# Patient Record
Sex: Female | Born: 1980 | Hispanic: No | Marital: Married | Smoking: Never smoker
Health system: Southern US, Community
[De-identification: ages and names within clinical notes are randomized; demographics above are authoritative.]

## PROBLEM LIST (undated history)

## (undated) DIAGNOSIS — Z9889 Other specified postprocedural states: Secondary | ICD-10-CM

## (undated) DIAGNOSIS — M419 Scoliosis, unspecified: Secondary | ICD-10-CM

## (undated) DIAGNOSIS — F1110 Opioid abuse, uncomplicated: Secondary | ICD-10-CM

## (undated) DIAGNOSIS — R112 Nausea with vomiting, unspecified: Secondary | ICD-10-CM

## (undated) HISTORY — PX: BACK SURGERY: SHX140

---

## 2005-03-20 ENCOUNTER — Emergency Department: Payer: Self-pay | Admitting: Emergency Medicine

## 2005-05-26 ENCOUNTER — Ambulatory Visit: Payer: Self-pay | Admitting: Internal Medicine

## 2005-05-26 ENCOUNTER — Inpatient Hospital Stay: Payer: Self-pay | Admitting: Internal Medicine

## 2005-06-11 ENCOUNTER — Ambulatory Visit: Payer: Self-pay | Admitting: Internal Medicine

## 2005-06-18 ENCOUNTER — Ambulatory Visit: Payer: Self-pay | Admitting: Internal Medicine

## 2008-02-28 ENCOUNTER — Observation Stay: Payer: Self-pay

## 2008-04-07 ENCOUNTER — Inpatient Hospital Stay: Payer: Self-pay | Admitting: Obstetrics & Gynecology

## 2008-04-15 ENCOUNTER — Emergency Department: Payer: Self-pay | Admitting: Emergency Medicine

## 2008-12-21 ENCOUNTER — Emergency Department: Payer: Self-pay | Admitting: Emergency Medicine

## 2010-05-04 ENCOUNTER — Inpatient Hospital Stay: Payer: Self-pay

## 2010-05-08 ENCOUNTER — Observation Stay: Payer: Self-pay

## 2010-05-09 ENCOUNTER — Ambulatory Visit: Payer: Self-pay

## 2010-05-26 ENCOUNTER — Observation Stay: Payer: Self-pay | Admitting: Unknown Physician Specialty

## 2010-06-08 ENCOUNTER — Observation Stay: Payer: Self-pay | Admitting: Obstetrics and Gynecology

## 2010-06-13 ENCOUNTER — Inpatient Hospital Stay: Payer: Self-pay | Admitting: Unknown Physician Specialty

## 2010-09-11 ENCOUNTER — Ambulatory Visit: Payer: Self-pay | Admitting: Orthopedic Surgery

## 2010-12-02 ENCOUNTER — Ambulatory Visit: Payer: Self-pay | Admitting: Family Medicine

## 2011-05-30 ENCOUNTER — Emergency Department: Payer: Self-pay | Admitting: Emergency Medicine

## 2012-12-21 IMAGING — CT CT HEAD WITHOUT CONTRAST
2 series · 16 of 30 positions shown, 20 images · non-contrast
Comparison: none

REASON FOR EXAM: new seizure
COMMENTS:   May transport without cardiac monitor

PROCEDURE:     CT  - CT HEAD WITHOUT CONTRAST  - May 30, 2011  [DATE]
RESULT:     Comparison:  None
TECHNIQUE: Multiple axial images from the foramen magnum to the vertex were
obtained without IV contrast.

[Series 2: without · axial · non-contrast · 0.40mm/px · z∈[-572,-452]mm · 13 of 29 slices shown, 17 images]
[im 3/29  brain]
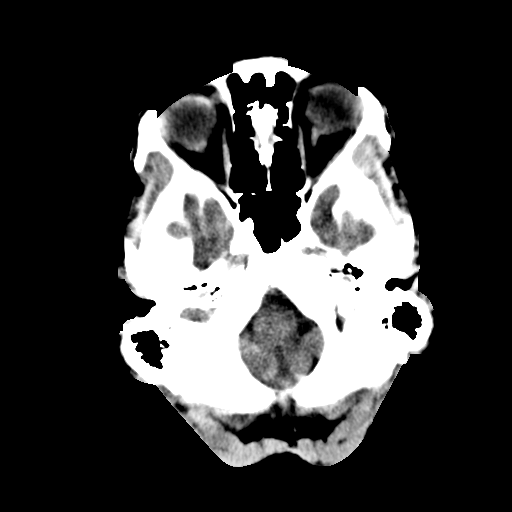
[im 3/29  bone]
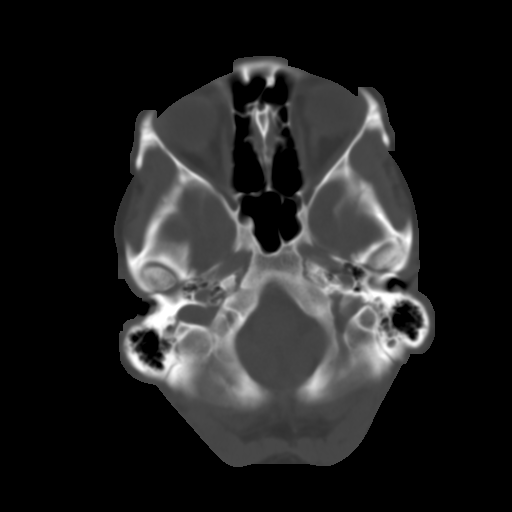
[im 5/29  brain]
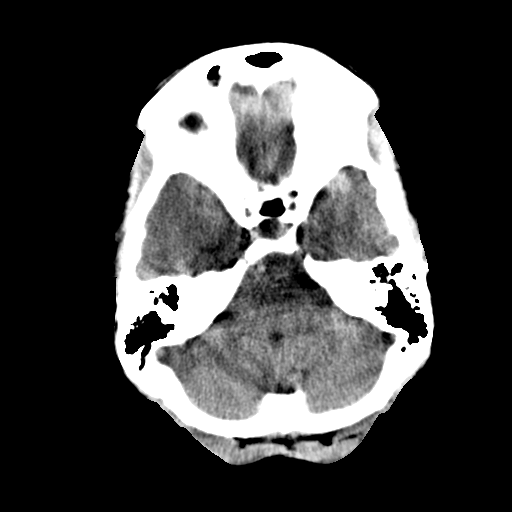
[im 7/29  brain]
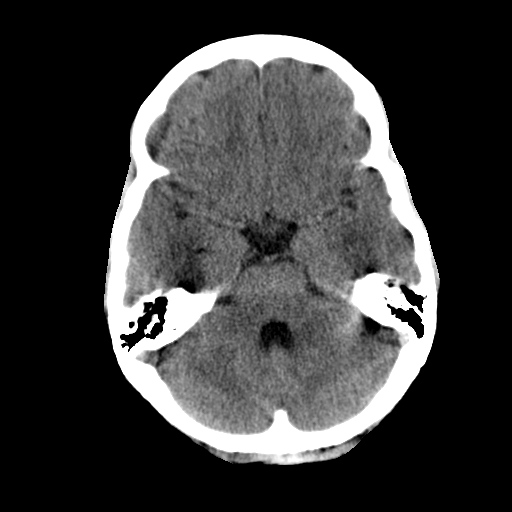
[im 9/29  brain]
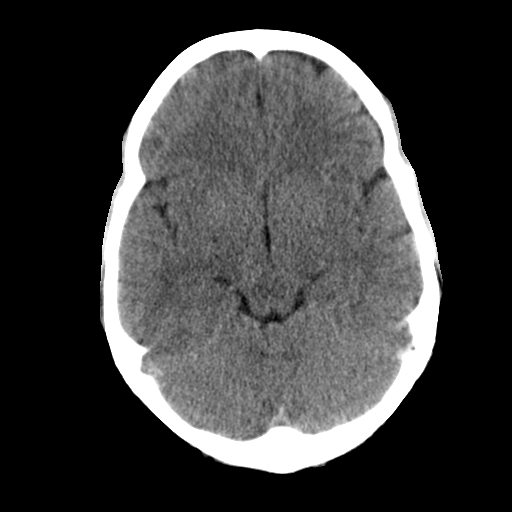
[im 11/29  brain]
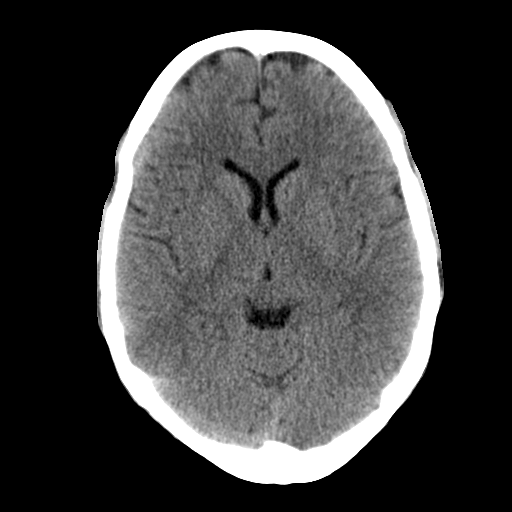
[im 11/29  bone]
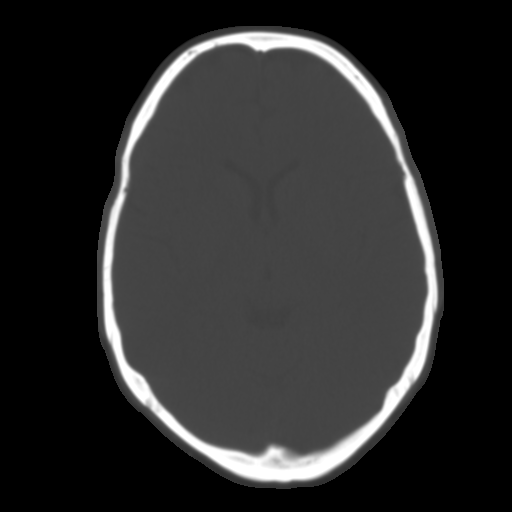
[im 13/29  brain]
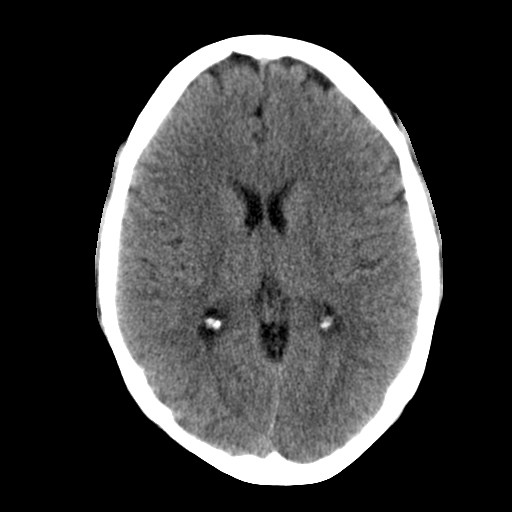
[im 15/29  brain]
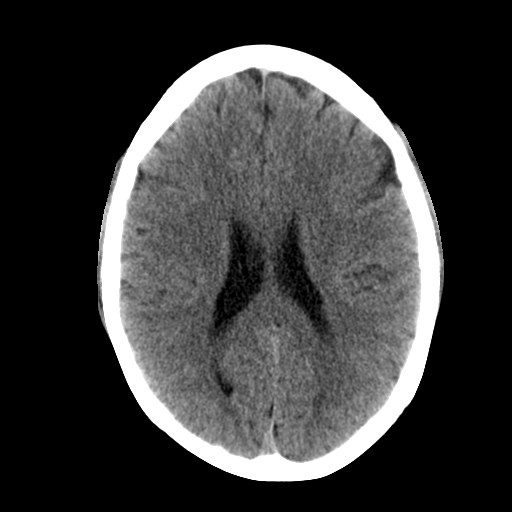
[im 17/29  brain]
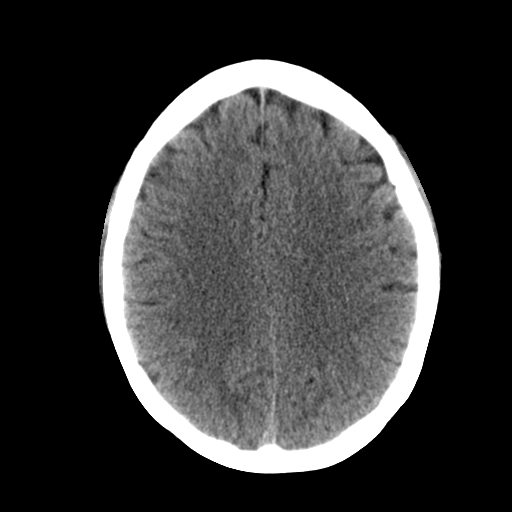
[im 19/29  brain]
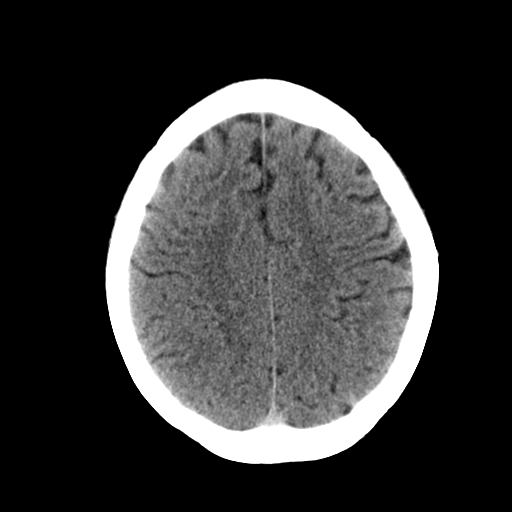
[im 19/29  bone]
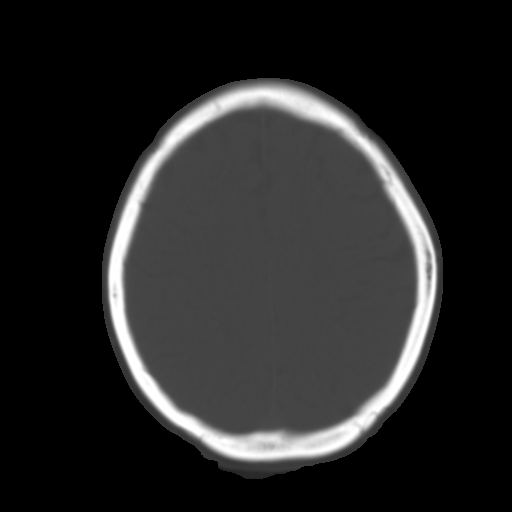
[im 21/29  brain]
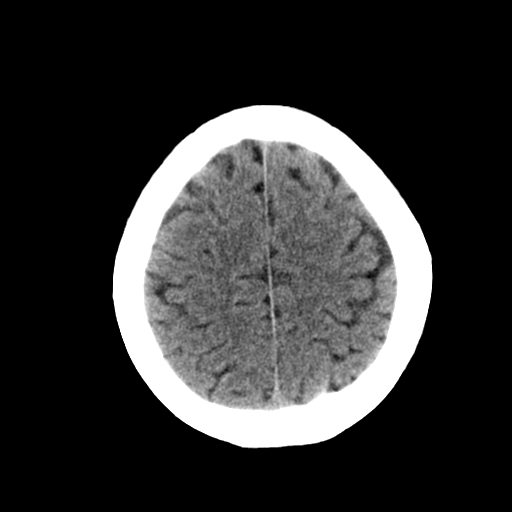
[im 23/29  brain]
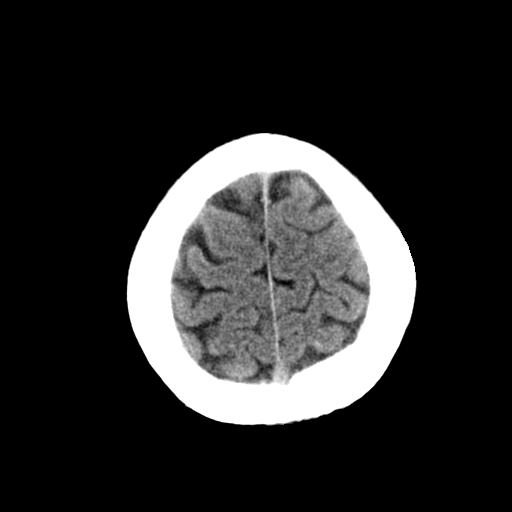
[im 25/29  brain]
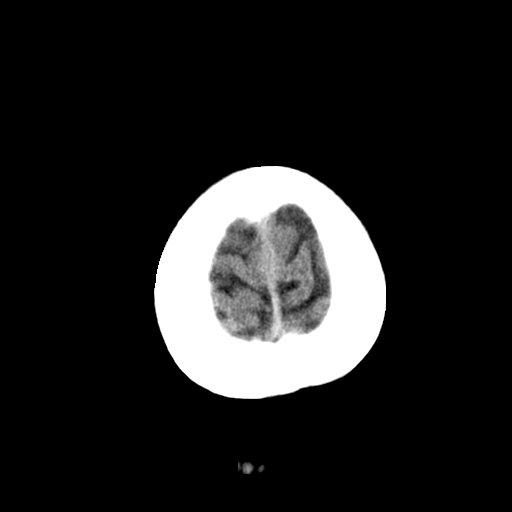
[im 27/29  brain]
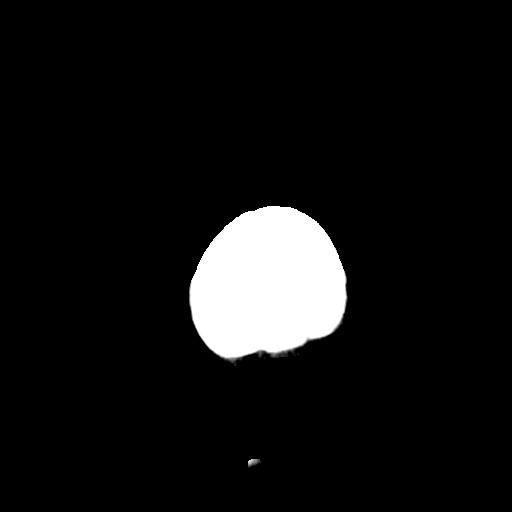
[im 27/29  bone]
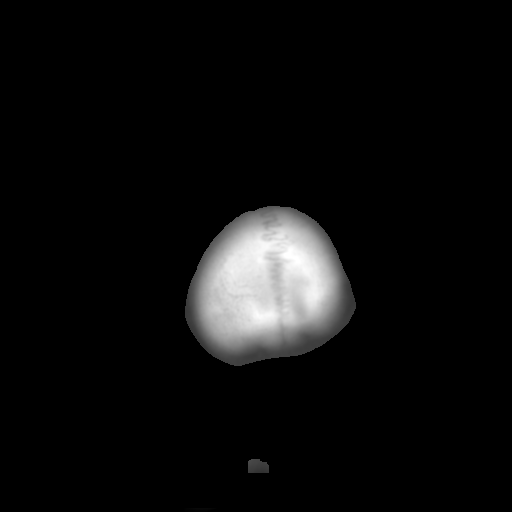

[Series 3: bone · axial · 0.40mm/px · z∈[-572,-532]mm · 3 of 29 slices shown]
[im 3/29  bone]
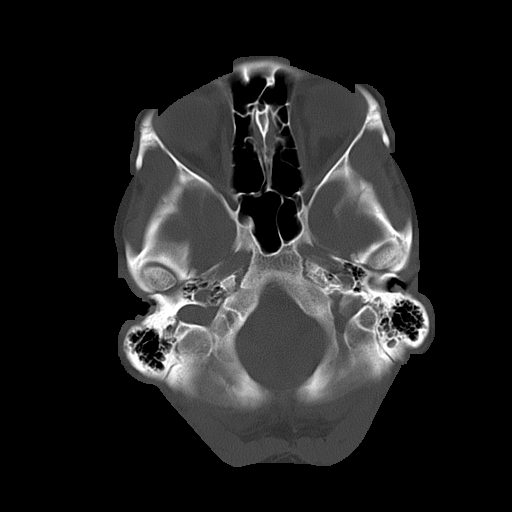
[im 7/29  bone]
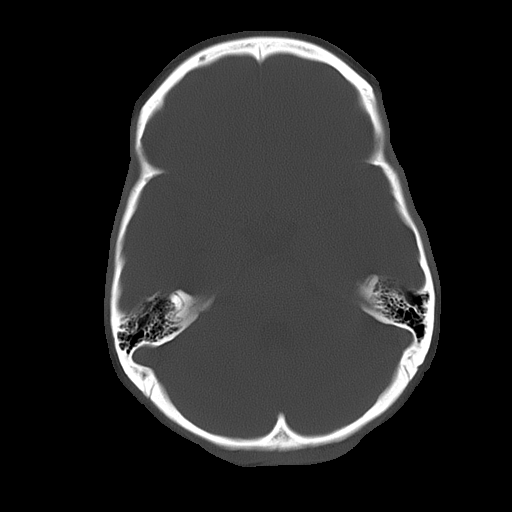
[im 11/29  bone]
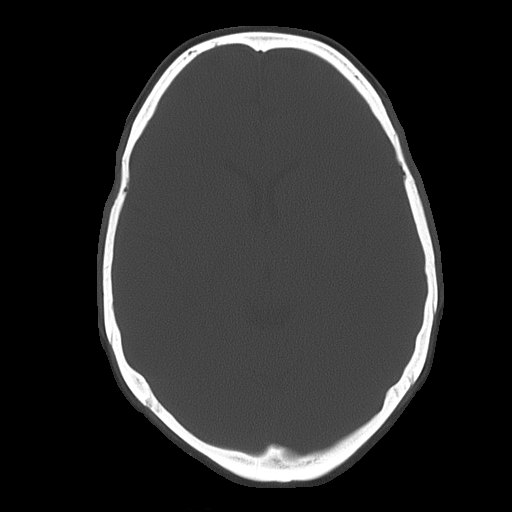

[16 of 30 positions shown; findings below may reference images not displayed]

FINDINGS: There is no evidence for mass effect, midline shift, or extra-axial fluid
collections. There is no evidence for space-occupying lesion, intracranial
hemorrhage, or cortical-based area of infarction.

The osseous structures are unremarkable.
IMPRESSION: No acute intracranial process.

## 2013-04-11 ENCOUNTER — Encounter: Payer: Self-pay | Admitting: Maternal & Fetal Medicine

## 2013-07-30 ENCOUNTER — Inpatient Hospital Stay: Payer: Self-pay | Admitting: Obstetrics & Gynecology

## 2013-07-30 LAB — DRUG SCREEN, URINE
Barbiturates, Ur Screen: NEGATIVE (ref ?–200)
Benzodiazepine, Ur Scrn: NEGATIVE (ref ?–200)
Cocaine Metabolite,Ur ~~LOC~~: NEGATIVE (ref ?–300)
Methadone, Ur Screen: NEGATIVE (ref ?–300)
Opiate, Ur Screen: NEGATIVE (ref ?–300)
Phencyclidine (PCP) Ur S: NEGATIVE (ref ?–25)
Tricyclic, Ur Screen: NEGATIVE (ref ?–1000)

## 2013-07-30 LAB — COMPREHENSIVE METABOLIC PANEL
Albumin: 2.7 g/dL — ABNORMAL LOW (ref 3.4–5.0)
Alkaline Phosphatase: 194 U/L — ABNORMAL HIGH (ref 50–136)
Anion Gap: 8 (ref 7–16)
BUN: 7 mg/dL (ref 7–18)
Bilirubin,Total: 0.1 mg/dL — ABNORMAL LOW (ref 0.2–1.0)
Calcium, Total: 8.9 mg/dL (ref 8.5–10.1)
Chloride: 106 mmol/L (ref 98–107)
Co2: 24 mmol/L (ref 21–32)
Creatinine: 0.53 mg/dL — ABNORMAL LOW (ref 0.60–1.30)
EGFR (African American): >60
EGFR (Non-African Amer.): >60
Glucose: 89 mg/dL (ref 65–99)
Osmolality: 273 (ref 275–301)
Potassium: 4 mmol/L (ref 3.5–5.1)
SGOT(AST): 29 U/L (ref 15–37)
SGPT (ALT): 21 U/L (ref 12–78)
Sodium: 138 mmol/L (ref 136–145)
Total Protein: 6.4 g/dL (ref 6.4–8.2)

## 2013-07-30 LAB — CBC WITH DIFFERENTIAL/PLATELET
Basophil %: 0.9 %
Eosinophil %: 3.9 %
HGB: 12 g/dL (ref 12.0–16.0)
Lymphocyte %: 16.9 %
MCH: 33.4 pg (ref 26.0–34.0)
Monocyte #: 1.3 x10 3/mm — ABNORMAL HIGH (ref 0.2–0.9)
Monocyte %: 8.3 %
Neutrophil #: 11.5 10*3/uL — ABNORMAL HIGH (ref 1.4–6.5)
WBC: 16.3 10*3/uL — ABNORMAL HIGH (ref 3.6–11.0)

## 2013-07-30 LAB — URINALYSIS, COMPLETE
Bacteria: NONE SEEN
Bilirubin,UR: NEGATIVE
Blood: NEGATIVE
Nitrite: NEGATIVE
Protein: NEGATIVE
RBC,UR: 2 /HPF (ref 0–5)

## 2013-07-30 LAB — PROTEIN / CREATININE RATIO, URINE
Creatinine, Urine: 13 mg/dL — ABNORMAL LOW (ref 30.0–125.0)
Protein, Random Urine: <5 mg/dL — ABNORMAL LOW (ref 0–12)

## 2013-08-02 LAB — PATHOLOGY REPORT

## 2014-11-03 IMAGING — US US OB DETAIL+14 WK - NRPT [facility]
1 series · 14 of 28 positions shown · non-contrast
Comparison: none

[Series 1: us ob detail+14 wk - nrpt [facility] · 0.25mm/px · 14 of 103 slices shown]
[im 4/103]
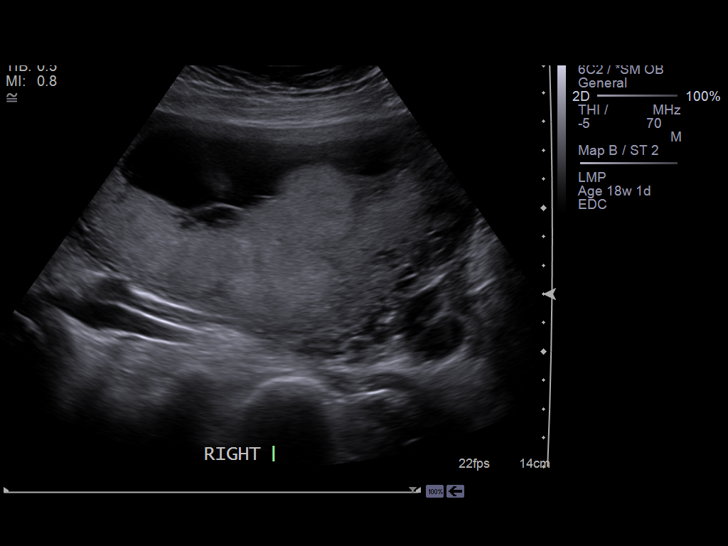
[im 12/103]
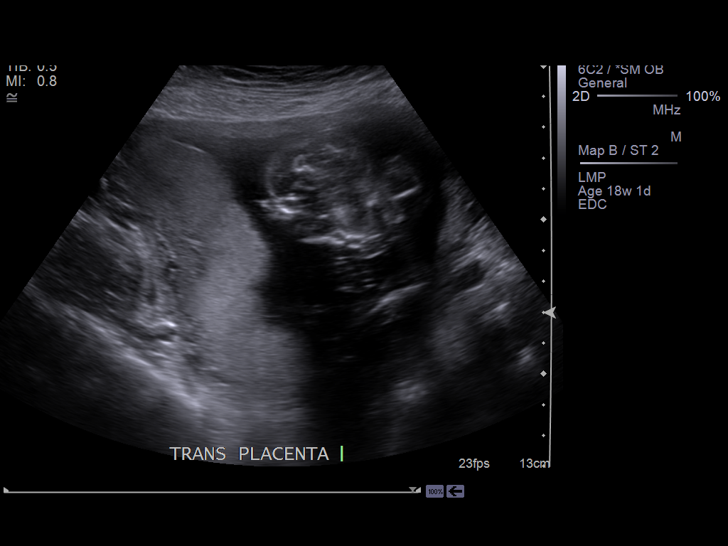
[im 19/103]
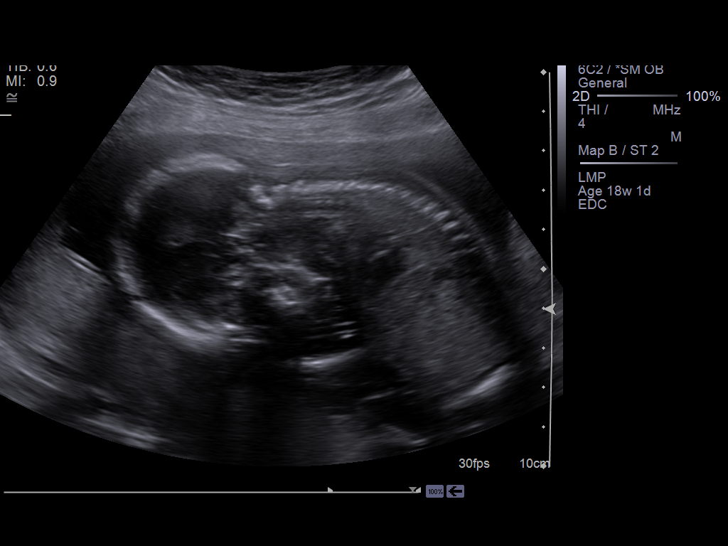
[im 27/103]
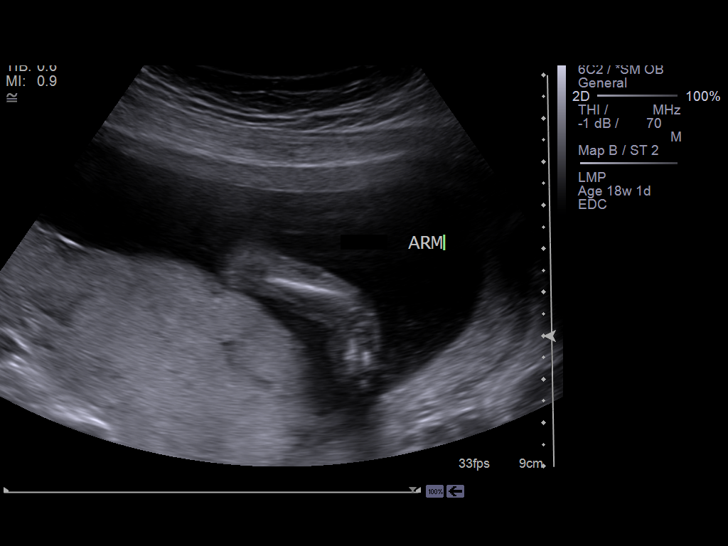
[im 35/103]
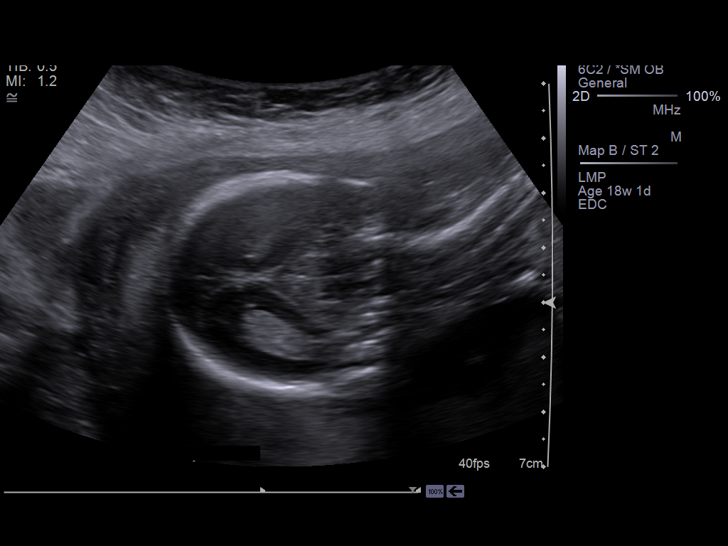
[im 42/103]
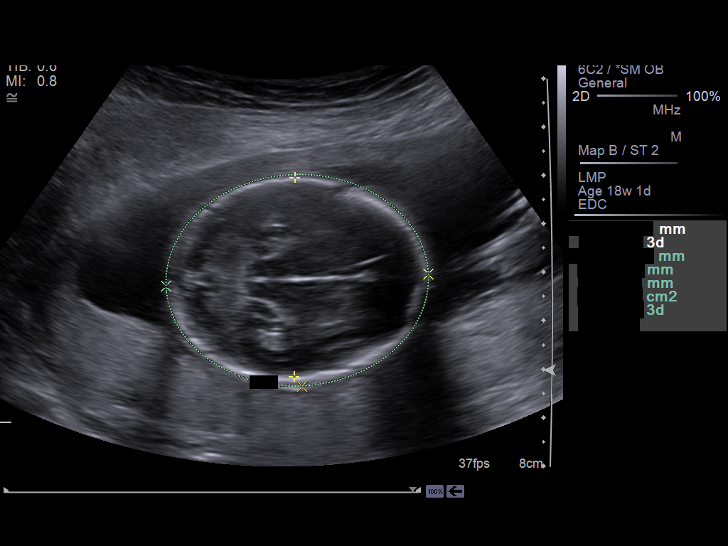
[im 50/103]
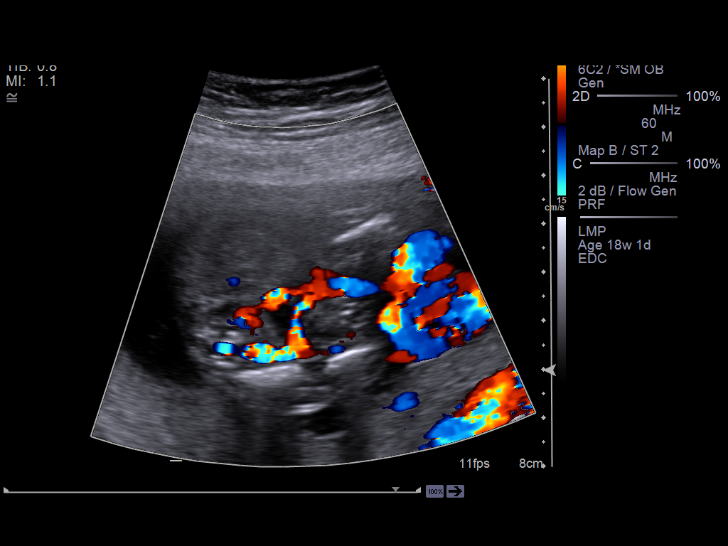
[im 57/103]
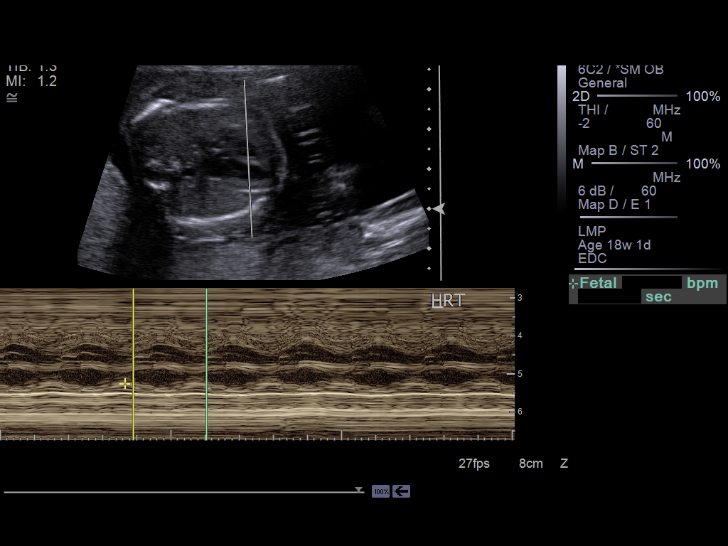
[im 65/103]
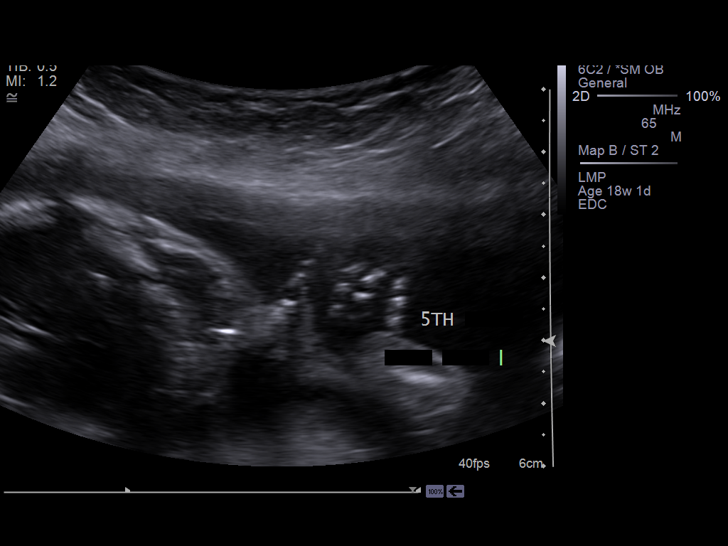
[im 72/103]
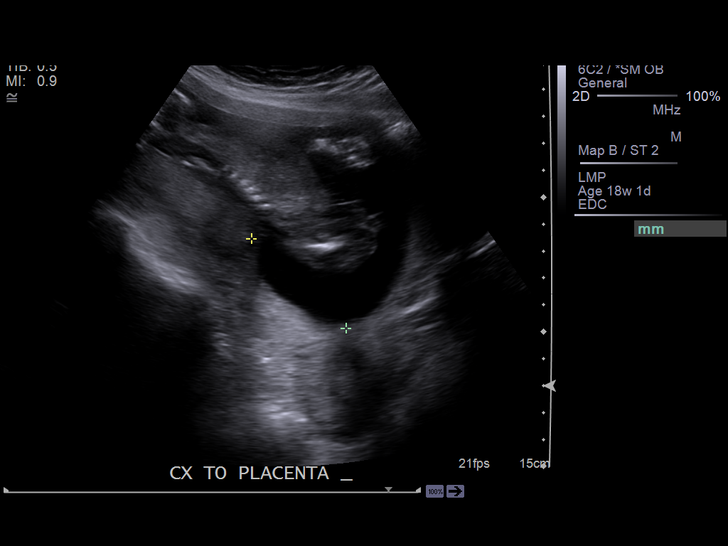
[im 80/103]
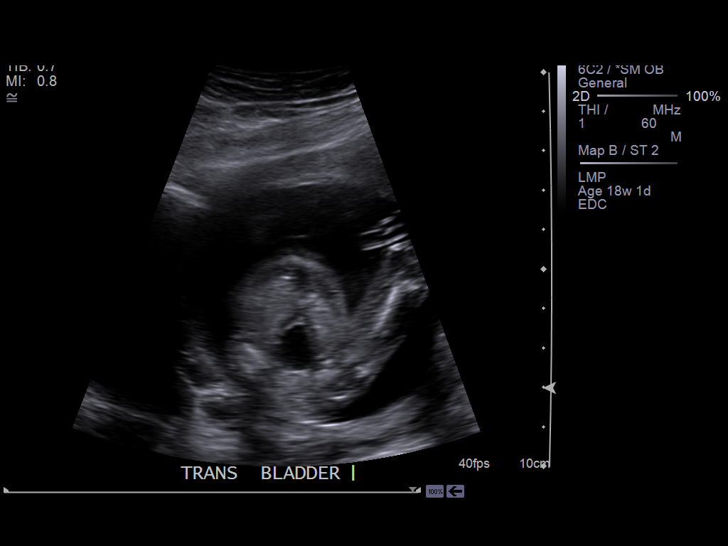
[im 87/103]
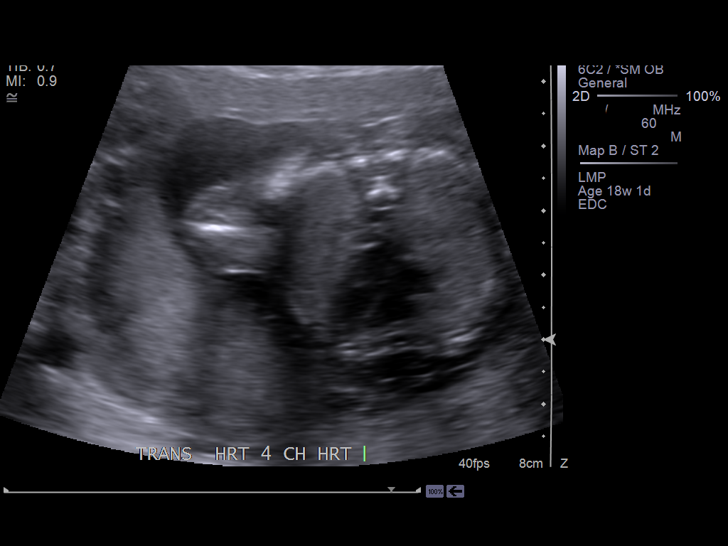
[im 95/103]
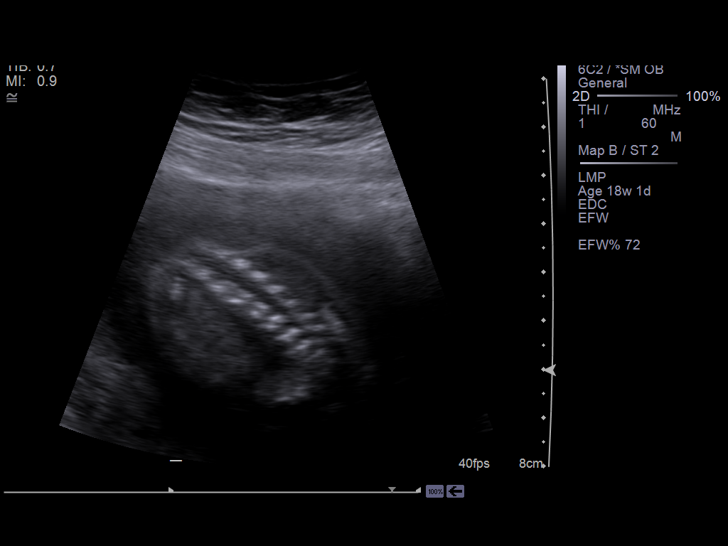
[im 103/103]
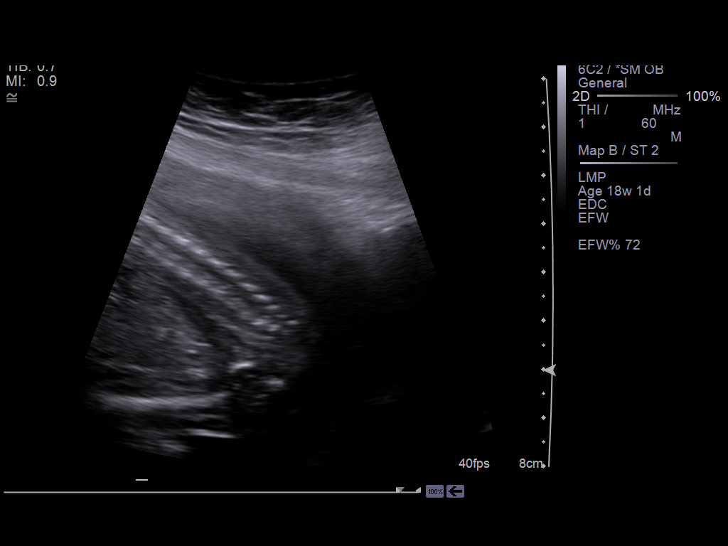

[14 of 28 positions shown; findings below may reference images not displayed]

IMAGES IMPORTED FROM THE [employer] WORKFLOW SYSTEM
NO DICTATION FOR STUDY

## 2015-04-13 NOTE — Consult Note (Signed)
Referral Information:  Reason for Referral 34 yo G1101 at 18 weeks 1 day by ultrasound performed at [facility] on 02/07/13; measurements were consistent with 9 weeks 1 day.  She is referred for consultation due to first trimester medication exposure (cymbalta), late preterm birth,  and history of nonhodgkins lymphoma (in remission).  She is accompanied by her husband today.   Referring Physician [facility]   Past Obstetrical Hx 2009, SVD 36/6 weeks; female, 6 lb 7 oz 2011, SVD, 38 weeks, induced due to hypertension, ?preeclampsia   Home Medications: Medication Instructions Status  Prenatal 1 Prenatal Multivitamins with Folic Acid 0.975 mg oral capsule 1 cap(s) orally once a day Active   Allergies:   Penicillin: Itching, Rash  All Dairy Products: Swelling, Other  Vital Signs/Notes:  Nursing Vital Signs: **Vital Signs.:   21-Apr-14 13:02  Vital Signs Type Routine  Temperature Temperature (F) 97.6  Celsius 36.4  Temperature Source oral  Pulse Pulse 80  Respirations Respirations 20  Systolic BP Systolic BP 114  Diastolic BP (mmHg) Diastolic BP (mmHg) 77  Mean BP 89  Pulse Ox % Pulse Ox % 99   Perinatal Consult:  PMed Hx Rubella Nonimmune, Hx of varicella   Past Medical History cont'd 1. NonHodgkins Lymphoma--diagnosed in 2006, in remission since 2007, followed by Dr. James Ivanoff (last seen 2013) of [facility].  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  Underwent 6 cycles of CHOP (csplatin, adriamycin, oncovorin, prednisone) from 2/07-5/07 2. Seizure disorder--generalized, seizure free for almost one year, took lamictal in past, negative brain MRI 2012.  3. Asthma--stable  4. 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  I did not feel she needed additional screening beyond her midtrimester cervical length (normal today) and general obstetric surveillance.  I discussed 17 Hydroxyprogesterone use in patients with high risk for recurrent preterm birth, however, she is not in the high risk category.  She agreed with this plan.  2. Non hodgkins lymphoma in remission--s/p CHOP. We addressed risk for cardiomyopathy expecially in the setting of adriamycin use with this regimen.  She had normal pre Chemo echocardiogram but is unsure if she has had a subsequent echocardiogram.  We discussed the pregnancy related cardiac remodeling/volume changes and I advised a baseline echocardiogram may be helpful.   Her baseline renal surveillance has been normal.  She will continue to follow up with [facility], Dr. Jeannett Senior.  3. History of hypertensive disease in pregnancy. She is not sure if she had preeclampsia but was induced at term for hypertension.  I advised low dose aspirin for preeclampsia risk reduction.   4. Scoliosis--unable to receive regional anesthesia in past. 5. Depression--she reports stable mood off cymbalta and is aware of her risk for recurrent postpartum depression. We addressed the low risk of teratogenicity associated with this medication and the fact that the highest period of teratogenicity has already occurred.  She also spoke with our genetic counselor regarding this exposure.  Please see that letter for details.  Recommendations 1.  I would recommend baseline maternal echocardiogram due to Adriamycin exposure. 2. Baseline p:c raio and liver function  panel due to history of hypertensive disease in pregnancy.  I recommended she start low dose aspirin.  3. Recommend close surveillance for depression and would encourage her to resume her Cymbalta if needed. 4.  She appears to be up to date with vaccinations and is aware of her relative immunocompromised status (due to splenectomy) Additional pregnancy surveillance as outlined below.   Plan:  Genetic Counseling yes   Prenatal Diagnosis Options Level II US   Ultrasound at what gestational ages at 26-28 weeks and 32-34 weeks    Total Time Spent with Patient 15 minutes   >50% of visit spent in couseling/coordination of care yes   Office Use Only [address]  Level 1 (15min) NEW office consult prob focused   Coding Description: OTHER: nonhodgkins lymphoma, scoliosis, depression.  Electronic Signatures: Consuelo PandySmall, Kirstin Kugler (MD)  (Signed 21-Apr-14 16:24)  Authored: Referral, Home Medications, Allergies, Vital Signs/Notes, Consult, Impression, Plan, Billing, Coding Description   Last Updated: 21-Apr-14 16:24 by Consuelo PandySmall, Ami Thornsberry (MD)

## 2015-05-01 NOTE — H&P (Signed)
L&D Evaluation:  History Expanded:  HPI 34 yo G3P1102 at 5648w6d gestational age by a 9 week ultrasound.  Her pregnancy is complicated by a history of preterm delivery 7450w6d gestational age, preterm labor with  G2 at 7232 weeks (though later induced at term for gestational hypertension), history of non-Hodgkins lymphoma in 2006 (history of adriamycin treatment), history of scoliosis with placement of Harrington rods.  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  She apparently has a history of alcohol addiction as well. She has a history of seizure disorder and is not treated for that. She had Cymbalta exposure early in her pregnancy, but weaned from that.   She presents after being awoken this morning with contractions at about 130am.  She notes worsening contractions and pain, positive fetal movement, no leakage of fluid, and no vaginal bleeding.   Gravida 3   Term 1   PreTerm 1   Abortion 0   Living 2   Blood Type (Maternal) O positive   Group B Strep Results Maternal (Result >5wks must be treated as unknown) unknown/result > 5 weeks ago   Maternal HIV Negative   Maternal Syphilis Ab Nonreactive   Maternal Varicella Non-Immune   Rubella Results (Maternal) equivocal   Maternal T-Dap Unknown   [facility]   Presents with contractions   Patient's Medical History 1) non-Hodgkins Lymphoma (diagnosed 2006, in remission since 2007. Treated with 6 cycles of CHOP (cisplatin, adriamycin, oncvorin, and prednisone from 2/07-5/07), 2) Seizure disorder - generalized without seizures for one year, took lamictal in past, 3) asthma -stable, 4) Scoliosis - s/p Harrington rod placement , 5) history of drug/EtOH abuse - s/p 30 day treatment in October 2013, 6) migraine headaches, 7) Major depression   Patient's Surgical History 1) Distal pancreatectomy and splenectomy in 2005, 2) harrington rod placement (early  2000's), removeal (2012), 3) tonsillecomty (1989)   Medications Pre Natal Vitamins   Allergies 1) PCN -hives, states is able to take cephalosporins, 2) tramadol - seizures   Social History married, history of drug/EtOH abuse - none current   Family History Non-Contributory   ROS:  ROS All systems were reviewed.  HEENT, CNS, GI, GU, Respiratory, CV, Renal and Musculoskeletal systems were found to be normal., unless noted in HPI   Exam:  Vital Signs Afebrile, 140/90, P 116   General mild distress with contractions   Mental Status clear   Chest mild expiratory wheezes throughout   Heart tachycardic, rhythm regular   Abdomen gravid, non-tender   Estimated Fetal Weight Average for gestational age   Fetal Position vtx   Back no CVAT   Edema no edema   Pelvic 6-7cm per RN   Mebranes Intact   FHT Description 140/mod var/+accels/no decels   Ucx 3-4 q 10 min   Impression:  Impression 1) Preterm labor, 2) elevated BPs   Plan:  Comments 1) Preterm labor at 4948w6d gestational age  - Will give BMTZ for fetal lung maturity - will attemp tocolysis with nifedipine 20mg , will repeat in 90 minutes and give 20mg  every 3-8 hours with no more than 180mg /day - Send U/A   2) Elevated BPs: she has a history of gestational hypertension vs preeclampsia.   - Will cycle BPs (though not document at time of this note, have all be >140/90).  Protein negative on dip - Send protein/creatinine ratio - preeclampsia labs - she has no symptoms  3) Fetal well being:  - BMTZ for fetal lung maturity, as above - Ancef 2g now, repeat in 8 hours (for PCN allergy) - GBS collected prior to initiation of antibiotics - NICU aware, will place consult   Labs:  Lab Results:  Urine Drugs:  09-Aug-14 04:35   Tricyclic Antidepressant, Ur Qual (comp) NEGATIVE (Result(s) reported on 30 Jul 2013 at 04:46AM.)  Amphetamines, Urine Qual. NEGATIVE  MDMA, Urine Qual. NEGATIVE  Cocaine Metabolite, Urine  Qual. NEGATIVE  Opiate, Urine qual NEGATIVE  Phencyclidine, Urine Qual. NEGATIVE  Cannabinoid, Urine Qual. NEGATIVE  Barbiturates, Urine Qual. NEGATIVE  Methadone, Urine Qual. NEGATIVE  Routine UA:  09-Aug-14 04:35   Color (UA) Colorless  Clarity (UA) Clear  Glucose (UA) Negative  Bilirubin (UA) Negative  Ketones (UA) Negative  Specific Gravity (UA) 1.002  Blood (UA) Negative  pH (UA) 7.0  Protein (UA) Negative  Nitrite (UA) Negative  Leukocyte Esterase (UA) Trace (Result(s) reported on 30 Jul 2013 at 04:51AM.)  RBC (UA) 2 /HPF  WBC (UA) 2 /HPF  Bacteria (UA) NONE SEEN  Epithelial Cells (UA) 2 /HPF (Result(s) reported on 30 Jul 2013 at 04:51AM.)  Routine Hem:  09-Aug-14 04:48   WBC (CBC)  16.3  RBC (CBC)  3.58  Hemoglobin (CBC) 12.0  Hematocrit (CBC)  34.2  Platelet Count (CBC) 305  MCV 96  MCH 33.4  MCHC 34.9  RDW 13.3  Neutrophil % 70.0  Lymphocyte % 16.9  Monocyte % 8.3  Eosinophil % 3.9  Basophil % 0.9  Neutrophil #  11.5  Lymphocyte # 2.8  Monocyte #  1.3  Eosinophil # 0.6  Basophil # 0.1 (Result(s) reported on 30 Jul 2013 at 05:05AM.)   Electronic Signatures: Conard NovakJackson, Dimond Crotty D (MD)  (Signed 09-Aug-14 05:24)  Authored: L&D Evaluation, Labs   Last Updated: 09-Aug-14 05:24 by Conard NovakJackson, Angeles Paolucci D (MD)

## 2015-12-23 NOTE — L&D Delivery Note (Signed)
Delivery Note At 12:53 AM a viable female was delivered via Vaginal, Spontaneous Delivery (Presentation LOA: ;  ).  APGAR:7/8 , ; weight 9 lb 4.9 oz (4220 g).   Placenta status:meconium stained  , .  Cord3 v:  with the following complications: .   Anesthesia:  nitrous Episiotomy:  none Lacerations:  none Suture Repair: n/a Est. Blood Loss (mL):  200 cc  large female delivered with Mcroberts . Intact perineum . Delayed crying with cord clamping and baby to warmer with PIA. Good movt of both arms . No complications Mom to postpartum.  Baby to Couplet care / Skin to Skin.  Janet Nelson 12/06/2016, 1:17 AM

## 2016-11-19 ENCOUNTER — Inpatient Hospital Stay
Admission: EM | Admit: 2016-11-19 | Discharge: 2016-11-19 | Disposition: A | Discharge status: Home / Self Care | Payer: Managed Care, Other (non HMO) | Attending: Obstetrics and Gynecology | Admitting: Obstetrics and Gynecology

## 2016-11-19 ENCOUNTER — Encounter: Payer: Self-pay | Admitting: *Deleted

## 2016-11-19 DIAGNOSIS — Z3490 Encounter for supervision of normal pregnancy, unspecified, unspecified trimester: Secondary | ICD-10-CM | POA: Insufficient documentation

## 2016-11-19 HISTORY — DX: Opioid abuse, uncomplicated: F11.10

## 2016-11-19 HISTORY — DX: Scoliosis, unspecified: M41.9

## 2016-11-19 MED ORDER — BETAMETHASONE SOD PHOS & ACET 6 (3-3) MG/ML IJ SUSP: 12.0000 mg | Freq: Once | INTRAMUSCULAR | Status: DC

## 2016-11-19 MED ORDER — BETAMETHASONE SOD PHOS & ACET 6 (3-3) MG/ML IJ SUSP
INTRAMUSCULAR | Status: AC
  Administered 2016-11-19: 12 mg via INTRAMUSCULAR
  Filled 2016-11-19: qty 1

## 2016-11-19 MED ORDER — TERBUTALINE SULFATE 1 MG/ML IJ SOLN
0.2500 mg | Freq: Once | INTRAMUSCULAR | Status: AC
  Administered 2016-11-19: 0.25 mg via SUBCUTANEOUS

## 2016-11-19 MED ORDER — BETAMETHASONE SOD PHOS & ACET 6 (3-3) MG/ML IJ SUSP
12.0000 mg | Freq: Once | INTRAMUSCULAR | Status: AC
  Administered 2016-11-19: 12 mg via INTRAMUSCULAR

## 2016-11-19 MED ORDER — TERBUTALINE SULFATE 1 MG/ML IJ SOLN
INTRAMUSCULAR | Status: AC
  Administered 2016-11-19: 0.25 mg via SUBCUTANEOUS
  Filled 2016-11-19: qty 1

## 2016-11-19 NOTE — Discharge Summary (Signed)
  Suzy Bouchardhomas J Schermerhorn, MD  Obstetrics    [] Hide copied text [] Hover for attribution information Patient ID: Janet HahnMelissa J Nelson, female   DOB: 03-29-1981, 35 y.o.   MRN: [id number] Janet Nelson 03-29-1981 G4 P1203Unknown presents for uterine ctx today , 2 prior PTD . received 17P  +LOF , no vaginal bleeding ,  PMHX: nonhodgkins llymphoma sciolosis sz Pshx: pancreatectomy,spinal  Fusion , splenectomy  FHX stroke  HTN   Meds PNV    O; VSS bp 148/92   Repeat 127/92ABD soft  CX 2/60/-2 by RN with recheck and no change   cx check by me at 1920 : 2/60/-1 vtx   NST reactive NST , regular ctx q 2-3 min  Labs:negative nitrazine ,   A: regular ctx without cervical change P:given the intensity will give one sq terbutaline and continue to monitor   1945 : given no cervical change and reassuring fetal monitoring I will give Betamethasone 12 mg IM and repeat in 24 given she is at high risk for PTD .  Precautions given to the pt to return to L+D , pt and husband are comfortable with this .     Electronically signed by Suzy Bouchardhomas J Schermerhorn, MD at 11/19/2016 7:43 PM      ED to Hosp-Admission (Current) on 11/19/2016        Detailed Report

## 2016-11-19 NOTE — Progress Notes (Signed)
Patient ID: Janet HahnMelissa J Nelson, female   DOB: July 02, 1981, 35 y.o.   MRN: [id number] Janet HahnMelissa J Nelson July 02, 1981 G4 P1203Unknown presents for uterine ctx today , 2 prior PTD . received 17P  +LOF , no vaginal bleeding ,  PMHX: nonhodgkins llymphoma sciolosis sz Pshx: pancreatectomy,spinal  Fusion , splenectomy  FHX stroke  HTN   Meds PNV    O; VSS bp 148/92   Repeat 127/92ABD soft  CX 2/60/-2 by RN with recheck and no change   cx check by me at 1920 : 2/60/-1 vtx   NST reactive NST , regular ctx q 2-3 min  Labs:negative nitrazine ,   A: regular ctx without cervical change P:given the intensity will give one sq terbutaline and continue to monitor   1945 : given no cervical change and reassuring fetal monitoring I will give Betamethasone 12 mg IM and repeat in 24 given she is at high risk for PTD .  Precautions given to the pt to return to L+D , pt and husband are comfortable with this .

## 2016-11-19 NOTE — Discharge Instructions (Signed)
Discharge instructions given. Please get plenty of rest and drink fluids. Please return to Emergency room if symptoms worse, period like bleeding, decreased fetal movement, or if water breaks.  Please return to [facility] tomorrow 11/20/2016 at 7:30pm for 2nd dose of Betamethasone shot.

## 2016-11-20 NOTE — Progress Notes (Signed)
Patient presented in L&D for second betamethasone injection.  Given IM in right lateral gluteus.

## 2016-12-05 ENCOUNTER — Inpatient Hospital Stay
Admission: RE | Admit: 2016-12-05 | Discharge: 2016-12-08 | DRG: 767 | Disposition: A | Discharge status: Home / Self Care | Payer: Managed Care, Other (non HMO) | Attending: Obstetrics and Gynecology | Admitting: Obstetrics and Gynecology

## 2016-12-05 DIAGNOSIS — D62 Acute posthemorrhagic anemia: Secondary | ICD-10-CM | POA: Diagnosis not present

## 2016-12-05 DIAGNOSIS — O9962 Diseases of the digestive system complicating childbirth: Secondary | ICD-10-CM | POA: Diagnosis present

## 2016-12-05 DIAGNOSIS — O134 Gestational [pregnancy-induced] hypertension without significant proteinuria, complicating childbirth: Principal | ICD-10-CM | POA: Diagnosis present

## 2016-12-05 DIAGNOSIS — Z6831 Body mass index (BMI) 31.0-31.9, adult: Secondary | ICD-10-CM

## 2016-12-05 DIAGNOSIS — K219 Gastro-esophageal reflux disease without esophagitis: Secondary | ICD-10-CM | POA: Diagnosis present

## 2016-12-05 DIAGNOSIS — O479 False labor, unspecified: Secondary | ICD-10-CM | POA: Diagnosis present

## 2016-12-05 DIAGNOSIS — O139 Gestational [pregnancy-induced] hypertension without significant proteinuria, unspecified trimester: Secondary | ICD-10-CM | POA: Diagnosis present

## 2016-12-05 DIAGNOSIS — O9081 Anemia of the puerperium: Secondary | ICD-10-CM | POA: Diagnosis not present

## 2016-12-05 DIAGNOSIS — Z3A38 38 weeks gestation of pregnancy: Secondary | ICD-10-CM

## 2016-12-05 DIAGNOSIS — M419 Scoliosis, unspecified: Secondary | ICD-10-CM | POA: Diagnosis present

## 2016-12-05 DIAGNOSIS — Z302 Encounter for sterilization: Secondary | ICD-10-CM

## 2016-12-05 DIAGNOSIS — R030 Elevated blood-pressure reading, without diagnosis of hypertension: Secondary | ICD-10-CM | POA: Diagnosis present

## 2016-12-05 HISTORY — DX: Other specified postprocedural states: Z98.890

## 2016-12-05 HISTORY — DX: Nausea with vomiting, unspecified: R11.2

## 2016-12-05 LAB — COMPREHENSIVE METABOLIC PANEL
ALBUMIN: 2.7 g/dL — AB (ref 3.5–5.0)
ALT: 20 U/L (ref 14–54)
AST: 33 U/L (ref 15–41)
Alkaline Phosphatase: 182 U/L — ABNORMAL HIGH (ref 38–126)
Anion gap: 8 (ref 5–15)
BILIRUBIN TOTAL: 0.4 mg/dL (ref 0.3–1.2)
BUN: 15 mg/dL (ref 6–20)
CALCIUM: 10.4 mg/dL — AB (ref 8.9–10.3)
CO2: 24 mmol/L (ref 22–32)
CREATININE: 0.76 mg/dL (ref 0.44–1.00)
Chloride: 102 mmol/L (ref 101–111)
Glucose, Bld: 89 mg/dL (ref 65–99)
Potassium: 3.5 mmol/L (ref 3.5–5.1)
SODIUM: 134 mmol/L — AB (ref 135–145)
Total Protein: 7.2 g/dL (ref 6.5–8.1)

## 2016-12-05 LAB — URINE DRUG SCREEN, QUALITATIVE (ARMC ONLY)
AMPHETAMINES, UR SCREEN: NOT DETECTED
Barbiturates, Ur Screen: NOT DETECTED
Benzodiazepine, Ur Scrn: NOT DETECTED
COCAINE METABOLITE, UR ~~LOC~~: NOT DETECTED
Cannabinoid 50 Ng, Ur ~~LOC~~: NOT DETECTED
MDMA (ECSTASY) UR SCREEN: NOT DETECTED
METHADONE SCREEN, URINE: NOT DETECTED
Opiate, Ur Screen: NOT DETECTED
Phencyclidine (PCP) Ur S: NOT DETECTED
TRICYCLIC, UR SCREEN: NOT DETECTED

## 2016-12-05 LAB — CBC
HCT: 32.9 % — ABNORMAL LOW (ref 35.0–47.0)
Hemoglobin: 11.2 g/dL — ABNORMAL LOW (ref 12.0–16.0)
MCH: 30.6 pg (ref 26.0–34.0)
MCHC: 34 g/dL (ref 32.0–36.0)
MCV: 89.9 fL (ref 80.0–100.0)
Platelets: 324 10*3/uL (ref 150–440)
RBC: 3.66 MIL/uL — AB (ref 3.80–5.20)
RDW: 13.4 % (ref 11.5–14.5)
WBC: 12.1 10*3/uL — AB (ref 3.6–11.0)

## 2016-12-05 LAB — CHLAMYDIA/NGC RT PCR (ARMC ONLY)
CHLAMYDIA TR: NOT DETECTED
N gonorrhoeae: NOT DETECTED

## 2016-12-05 LAB — PROTEIN / CREATININE RATIO, URINE
CREATININE, URINE: 80 mg/dL
PROTEIN CREATININE RATIO: 0.14 mg/mg{creat} (ref 0.00–0.15)
TOTAL PROTEIN, URINE: 11 mg/dL

## 2016-12-05 LAB — OB RESULTS CONSOLE GBS: GBS: POSITIVE

## 2016-12-05 LAB — URIC ACID: Uric Acid, Serum: 7.1 mg/dL — ABNORMAL HIGH (ref 2.3–6.6)

## 2016-12-05 MED ORDER — SOD CITRATE-CITRIC ACID 500-334 MG/5ML PO SOLN: 30.0000 mL | ORAL | Status: DC | PRN

## 2016-12-05 MED ORDER — ACETAMINOPHEN 325 MG PO TABS: 650.0000 mg | ORAL_TABLET | ORAL | Status: DC | PRN

## 2016-12-05 MED ORDER — CEFAZOLIN SODIUM-DEXTROSE 2-4 GM/100ML-% IV SOLN
2.0000 g | Freq: Once | INTRAVENOUS | Status: AC
  Administered 2016-12-05: 2 g via INTRAVENOUS
  Filled 2016-12-05 (×2): qty 100

## 2016-12-05 MED ORDER — OXYCODONE-ACETAMINOPHEN 5-325 MG PO TABS: 2.0000 | ORAL_TABLET | ORAL | Status: DC | PRN

## 2016-12-05 MED ORDER — FAMOTIDINE 20 MG PO TABS
20.0000 mg | ORAL_TABLET | Freq: Two times a day (BID) | ORAL | Status: DC
  Administered 2016-12-05 – 2016-12-07 (×4): 20 mg via ORAL
  Filled 2016-12-05 (×4): qty 1

## 2016-12-05 MED ORDER — TERBUTALINE SULFATE 1 MG/ML IJ SOLN: 0.2500 mg | Freq: Once | INTRAMUSCULAR | Status: DC | PRN

## 2016-12-05 MED ORDER — OXYCODONE-ACETAMINOPHEN 5-325 MG PO TABS: 1.0000 | ORAL_TABLET | ORAL | Status: DC | PRN

## 2016-12-05 MED ORDER — LIDOCAINE HCL (PF) 1 % IJ SOLN: 30.0000 mL | INTRAMUSCULAR | Status: DC | PRN

## 2016-12-05 MED ORDER — LACTATED RINGERS IV SOLN: 500.0000 mL | INTRAVENOUS | Status: DC | PRN

## 2016-12-05 MED ORDER — CEFAZOLIN IN D5W 1 GM/50ML IV SOLN
1.0000 g | Freq: Three times a day (TID) | INTRAVENOUS | Status: DC
  Administered 2016-12-06: 1 g via INTRAVENOUS
  Filled 2016-12-05 (×4): qty 50

## 2016-12-05 MED ORDER — OXYTOCIN 40 UNITS IN LACTATED RINGERS INFUSION - SIMPLE MED
2.5000 [IU]/h | INTRAVENOUS | Status: DC
Start: 2016-12-05 — End: 2016-12-06

## 2016-12-05 MED ORDER — CALCIUM CARBONATE ANTACID 500 MG PO CHEW
CHEWABLE_TABLET | ORAL | Status: AC
  Filled 2016-12-05: qty 1

## 2016-12-05 MED ORDER — SODIUM CHLORIDE 0.9 % IV SOLN: 2.0000 g | Freq: Once | INTRAVENOUS | Status: DC

## 2016-12-05 MED ORDER — FAMOTIDINE 20 MG PO TABS
ORAL_TABLET | ORAL | Status: AC
  Administered 2016-12-05: 20 mg via ORAL
  Filled 2016-12-05: qty 1

## 2016-12-05 MED ORDER — ONDANSETRON HCL 4 MG/2ML IJ SOLN: 4.0000 mg | Freq: Four times a day (QID) | INTRAMUSCULAR | Status: DC | PRN

## 2016-12-05 MED ORDER — OXYTOCIN BOLUS FROM INFUSION: 500.0000 mL | Freq: Once | INTRAVENOUS | Status: DC

## 2016-12-05 MED ORDER — SODIUM CHLORIDE 0.9 % IV SOLN: 1.0000 g | INTRAVENOUS | Status: DC

## 2016-12-05 MED ORDER — BUTORPHANOL TARTRATE 1 MG/ML IJ SOLN: 1.0000 mg | INTRAMUSCULAR | Status: DC | PRN

## 2016-12-05 MED ORDER — LACTATED RINGERS IV SOLN: INTRAVENOUS | Status: DC

## 2016-12-05 MED ORDER — LABETALOL HCL 5 MG/ML IV SOLN
20.0000 mg | INTRAVENOUS | Status: DC | PRN
  Filled 2016-12-05: qty 16

## 2016-12-05 MED ORDER — LACTATED RINGERS IV SOLN
500.0000 mL | INTRAVENOUS | Status: DC | PRN
  Administered 2016-12-06: 500 mL via INTRAVENOUS

## 2016-12-05 MED ORDER — OXYTOCIN 40 UNITS IN LACTATED RINGERS INFUSION - SIMPLE MED: 2.5000 [IU]/h | INTRAVENOUS | Status: DC

## 2016-12-05 MED ORDER — HYDRALAZINE HCL 20 MG/ML IJ SOLN: 10.0000 mg | Freq: Once | INTRAMUSCULAR | Status: DC | PRN

## 2016-12-05 NOTE — Plan of Care (Signed)
Received ancef from pharmacy. Will hang and administer now

## 2016-12-05 NOTE — Progress Notes (Signed)
Pharmacy called regarding ancef 2gm ivpb.

## 2016-12-05 NOTE — H&P (Signed)
Janet Nelson is a 35 y.o. female presenting for  Elevated BP this am in office . CX 4 cm . OB History    Gravida Para Term Preterm AB Living   4 3 1 2  0 3   SAB TAB Ectopic Multiple Live Births                 Past Medical History:  Diagnosis Date  . Opioid abuse    clean for 4 years  . Scoliosis    Past Surgical History:  Procedure Laterality Date  . BACK SURGERY     Family History: family history is not on file. Social History:  reports that she has never smoked. She has never used smokeless tobacco. She reports that she does not drink alcohol or use drugs.     Maternal Diabetes: No Genetic Screening: Normal Maternal Ultrasounds/Referrals: Normal Fetal Ultrasounds or other Referrals:  None Maternal Substance Abuse:  No Significant Maternal Medications:  None Significant Maternal Lab Results:  None Other Comments:  None  ROS History   Last menstrual period 03/15/2016. Examlungs CTA  CV rrr  abd  Pelvic 4 cm in office  Physical Exam  Prenatal labs: ABO, Rh:  O+ Antibody:  neg Rubella:  NOn Immune  VZ : NOn immune RPR:   neg HBsAg:   neg HIV:   neg GBS:   unknown   Assessment/Plan: Recent Gestational hypertension  , advanced cervix  IV BP labetolol and start ancef for unknown GBS   SCHERMERHORN,THOMAS 12/05/2016, 3:22 PM

## 2016-12-05 NOTE — Progress Notes (Deleted)
Patient ID: Janet HahnMelissa J Nelson, female   DOB: January 07, 1981, 35 y.o.   MRN: [id number] AROM . Meconium stained   FSE placed  CX 4cm / 80 / 0  discussed the meaning of meconium with pt and family

## 2016-12-05 NOTE — Plan of Care (Signed)
Pt here for induction of labor for elevated blood pressures.

## 2016-12-06 ENCOUNTER — Encounter
Admission: RE | Disposition: A | Discharge status: Home / Self Care | Payer: Self-pay | Source: Home / Self Care | Attending: Obstetrics and Gynecology

## 2016-12-06 ENCOUNTER — Inpatient Hospital Stay: Payer: Managed Care, Other (non HMO) | Admitting: Anesthesiology

## 2016-12-06 HISTORY — PX: TUBAL LIGATION: SHX77

## 2016-12-06 LAB — CBC
HEMATOCRIT: 27.4 % — AB (ref 35.0–47.0)
Hemoglobin: 9 g/dL — ABNORMAL LOW (ref 12.0–16.0)
MCH: 30 pg (ref 26.0–34.0)
MCHC: 33 g/dL (ref 32.0–36.0)
MCV: 90.9 fL (ref 80.0–100.0)
Platelets: 268 10*3/uL (ref 150–440)
RBC: 3.01 MIL/uL — AB (ref 3.80–5.20)
RDW: 13.7 % (ref 11.5–14.5)
WBC: 20.3 10*3/uL — AB (ref 3.6–11.0)

## 2016-12-06 LAB — RPR: RPR: NONREACTIVE

## 2016-12-06 SURGERY — LIGATION, FALLOPIAN TUBE, POSTPARTUM
Anesthesia: General | Site: Abdomen | Laterality: Bilateral | Wound class: Clean

## 2016-12-06 MED ORDER — HYDROCODONE-ACETAMINOPHEN 5-325 MG PO TABS: 1.0000 | ORAL_TABLET | ORAL | Status: DC | PRN

## 2016-12-06 MED ORDER — ZOLPIDEM TARTRATE 5 MG PO TABS: 5.0000 mg | ORAL_TABLET | Freq: Every evening | ORAL | Status: DC | PRN

## 2016-12-06 MED ORDER — PROMETHAZINE HCL 25 MG/ML IJ SOLN
6.2500 mg | INTRAMUSCULAR | Status: DC | PRN
  Administered 2016-12-06: 12.5 mg via INTRAVENOUS

## 2016-12-06 MED ORDER — ACETAMINOPHEN 325 MG PO TABS
650.0000 mg | ORAL_TABLET | ORAL | Status: DC | PRN
  Administered 2016-12-06 (×3): 650 mg via ORAL
  Filled 2016-12-06 (×4): qty 2

## 2016-12-06 MED ORDER — LIDOCAINE HCL (CARDIAC) 20 MG/ML IV SOLN
INTRAVENOUS | Status: DC | PRN
  Administered 2016-12-06: 50 mg via INTRAVENOUS

## 2016-12-06 MED ORDER — DIBUCAINE 1 % RE OINT: 1.0000 "application " | TOPICAL_OINTMENT | RECTAL | Status: DC | PRN

## 2016-12-06 MED ORDER — ROCURONIUM BROMIDE 100 MG/10ML IV SOLN
INTRAVENOUS | Status: DC | PRN
  Administered 2016-12-06: 25 mg via INTRAVENOUS

## 2016-12-06 MED ORDER — PROPOFOL 10 MG/ML IV BOLUS
INTRAVENOUS | Status: DC | PRN
  Administered 2016-12-06: 140 mg via INTRAVENOUS

## 2016-12-06 MED ORDER — SENNOSIDES-DOCUSATE SODIUM 8.6-50 MG PO TABS
2.0000 | ORAL_TABLET | ORAL | Status: DC
  Administered 2016-12-07 (×2): 2 via ORAL
  Filled 2016-12-06 (×2): qty 2

## 2016-12-06 MED ORDER — PRENATAL MULTIVITAMIN CH
1.0000 | ORAL_TABLET | Freq: Every day | ORAL | Status: DC
  Administered 2016-12-06 – 2016-12-07 (×2): 1 via ORAL
  Filled 2016-12-06 (×2): qty 1

## 2016-12-06 MED ORDER — FENTANYL CITRATE (PF) 100 MCG/2ML IJ SOLN
25.0000 ug | INTRAMUSCULAR | Status: DC | PRN
  Administered 2016-12-06 (×2): 25 ug via INTRAVENOUS

## 2016-12-06 MED ORDER — BENZOCAINE-MENTHOL 20-0.5 % EX AERO: 1.0000 "application " | INHALATION_SPRAY | CUTANEOUS | Status: DC | PRN

## 2016-12-06 MED ORDER — PROMETHAZINE HCL 25 MG/ML IJ SOLN
INTRAMUSCULAR | Status: AC
  Administered 2016-12-06: 12.5 mg via INTRAVENOUS
  Filled 2016-12-06: qty 1

## 2016-12-06 MED ORDER — COCONUT OIL OIL: 1.0000 "application " | TOPICAL_OIL | Status: DC | PRN

## 2016-12-06 MED ORDER — ACETAMINOPHEN 10 MG/ML IV SOLN
INTRAVENOUS | Status: DC | PRN
  Administered 2016-12-06: 1000 mg via INTRAVENOUS

## 2016-12-06 MED ORDER — DIPHENHYDRAMINE HCL 25 MG PO CAPS
25.0000 mg | ORAL_CAPSULE | Freq: Four times a day (QID) | ORAL | Status: DC | PRN
Start: 2016-12-06 — End: 2016-12-08
  Administered 2016-12-07: 25 mg via ORAL
  Filled 2016-12-06: qty 1

## 2016-12-06 MED ORDER — LABETALOL HCL 100 MG PO TABS
ORAL_TABLET | ORAL | Status: AC
  Administered 2016-12-06: 100 mg via ORAL
  Filled 2016-12-06: qty 1

## 2016-12-06 MED ORDER — FERROUS SULFATE 325 (65 FE) MG PO TABS
325.0000 mg | ORAL_TABLET | Freq: Two times a day (BID) | ORAL | Status: DC
  Administered 2016-12-06 – 2016-12-08 (×4): 325 mg via ORAL
  Filled 2016-12-06 (×5): qty 1

## 2016-12-06 MED ORDER — FENTANYL CITRATE (PF) 100 MCG/2ML IJ SOLN
INTRAMUSCULAR | Status: DC | PRN
  Administered 2016-12-06: 100 ug via INTRAVENOUS

## 2016-12-06 MED ORDER — AMMONIA AROMATIC IN INHA
RESPIRATORY_TRACT | Status: AC
  Filled 2016-12-06: qty 20

## 2016-12-06 MED ORDER — SERTRALINE HCL 100 MG PO TABS
100.0000 mg | ORAL_TABLET | Freq: Every day | ORAL | Status: DC
  Administered 2016-12-06 – 2016-12-08 (×3): 100 mg via ORAL
  Filled 2016-12-06 (×3): qty 1

## 2016-12-06 MED ORDER — ONDANSETRON HCL 4 MG/2ML IJ SOLN: 4.0000 mg | INTRAMUSCULAR | Status: DC | PRN

## 2016-12-06 MED ORDER — SUCCINYLCHOLINE CHLORIDE 20 MG/ML IJ SOLN
INTRAMUSCULAR | Status: DC | PRN
  Administered 2016-12-06: 100 mg via INTRAVENOUS

## 2016-12-06 MED ORDER — LABETALOL HCL 100 MG PO TABS
100.0000 mg | ORAL_TABLET | Freq: Two times a day (BID) | ORAL | Status: DC
  Administered 2016-12-06 – 2016-12-08 (×6): 100 mg via ORAL
  Filled 2016-12-06 (×5): qty 1

## 2016-12-06 MED ORDER — IBUPROFEN 600 MG PO TABS
600.0000 mg | ORAL_TABLET | Freq: Four times a day (QID) | ORAL | Status: DC
  Administered 2016-12-06 – 2016-12-08 (×10): 600 mg via ORAL
  Filled 2016-12-06 (×9): qty 1

## 2016-12-06 MED ORDER — SUGAMMADEX SODIUM 200 MG/2ML IV SOLN
INTRAVENOUS | Status: DC | PRN
  Administered 2016-12-06: 200 mg via INTRAVENOUS

## 2016-12-06 MED ORDER — MAGNESIUM HYDROXIDE 400 MG/5ML PO SUSP: 30.0000 mL | ORAL | Status: DC | PRN

## 2016-12-06 MED ORDER — BUPIVACAINE LIPOSOME 1.3 % IJ SUSP
INTRAMUSCULAR | Status: DC | PRN
  Administered 2016-12-06: 40 mL

## 2016-12-06 MED ORDER — MEASLES, MUMPS & RUBELLA VAC ~~LOC~~ INJ
0.5000 mL | INJECTION | Freq: Once | SUBCUTANEOUS | Status: DC
  Filled 2016-12-06: qty 0.5

## 2016-12-06 MED ORDER — KETOROLAC TROMETHAMINE 30 MG/ML IJ SOLN
INTRAMUSCULAR | Status: DC | PRN
  Administered 2016-12-06: 30 mg via INTRAVENOUS

## 2016-12-06 MED ORDER — LACTATED RINGERS IV SOLN
INTRAVENOUS | Status: DC | PRN
  Administered 2016-12-06: 11:00:00 via INTRAVENOUS

## 2016-12-06 MED ORDER — WITCH HAZEL-GLYCERIN EX PADS: 1.0000 "application " | MEDICATED_PAD | CUTANEOUS | Status: DC | PRN

## 2016-12-06 MED ORDER — IBUPROFEN 600 MG PO TABS
ORAL_TABLET | ORAL | Status: AC
  Administered 2016-12-06: 600 mg via ORAL
  Filled 2016-12-06: qty 1

## 2016-12-06 MED ORDER — ONDANSETRON HCL 4 MG PO TABS: 4.0000 mg | ORAL_TABLET | ORAL | Status: DC | PRN

## 2016-12-06 MED ORDER — FENTANYL CITRATE (PF) 100 MCG/2ML IJ SOLN
INTRAMUSCULAR | Status: AC
  Administered 2016-12-06: 25 ug via INTRAVENOUS
  Filled 2016-12-06: qty 2

## 2016-12-06 MED ORDER — SIMETHICONE 80 MG PO CHEW: 80.0000 mg | CHEWABLE_TABLET | ORAL | Status: DC | PRN

## 2016-12-06 SURGICAL SUPPLY — 41 items
APPLICATOR COTTON TIP 6IN STRL (MISCELLANEOUS) ×3 IMPLANT
BLADE SURG SZ11 CARB STEEL (BLADE) ×3 IMPLANT
CANISTER SUCT 1200ML W/VALVE (MISCELLANEOUS) ×3 IMPLANT
CHLORAPREP W/TINT 26ML (MISCELLANEOUS) ×3 IMPLANT
CLOSURE WOUND 1/4X4 (GAUZE/BANDAGES/DRESSINGS) ×1
DRAPE LAPAROTOMY 100X77 ABD (DRAPES) ×3 IMPLANT
DRSG TEGADERM 2-3/8X2-3/4 SM (GAUZE/BANDAGES/DRESSINGS) ×3 IMPLANT
DRSG TEGADERM 4X4.75 (GAUZE/BANDAGES/DRESSINGS) ×3 IMPLANT
ELECT CAUTERY BLADE 6.4 (BLADE) ×3 IMPLANT
ELECT REM PT RETURN 9FT ADLT (ELECTROSURGICAL) ×3
ELECTRODE REM PT RTRN 9FT ADLT (ELECTROSURGICAL) ×1 IMPLANT
GAUZE SPONGE NON-WVN 2X2 STRL (MISCELLANEOUS) ×1 IMPLANT
GLOVE BIO SURGEON STRL SZ8 (GLOVE) ×3 IMPLANT
GLOVE PI ORTHOPRO 6.5 (GLOVE) ×2
GLOVE PI ORTHOPRO STRL 6.5 (GLOVE) IMPLANT
GLOVE SURG SYN 6.5 ES PF (GLOVE) ×3 IMPLANT
GLOVE SURG SYN 6.5 PF PI (GLOVE) IMPLANT
GOWN STRL REUS W/ TWL LRG LVL3 (GOWN DISPOSABLE) ×1 IMPLANT
GOWN STRL REUS W/ TWL XL LVL3 (GOWN DISPOSABLE) ×1 IMPLANT
GOWN STRL REUS W/TWL LRG LVL3 (GOWN DISPOSABLE) ×3
GOWN STRL REUS W/TWL XL LVL3 (GOWN DISPOSABLE) ×3
KIT RM TURNOVER STRD PROC AR (KITS) ×3 IMPLANT
LABEL OR SOLS (LABEL) ×3 IMPLANT
LIQUID BAND (GAUZE/BANDAGES/DRESSINGS) ×3 IMPLANT
NDL HYPO 25X1 1.5 SAFETY (NEEDLE) ×1 IMPLANT
NEEDLE HYPO 25X1 1.5 SAFETY (NEEDLE) ×3 IMPLANT
NS IRRIG 500ML POUR BTL (IV SOLUTION) ×3 IMPLANT
PACK BASIN MINOR ARMC (MISCELLANEOUS) ×3 IMPLANT
RETRACTOR WOUND ALXS 18CM SML (MISCELLANEOUS) IMPLANT
RTRCTR WOUND ALEXIS O 18CM SML (MISCELLANEOUS) ×3
SPONGE LAP 18X18 5 PK (GAUZE/BANDAGES/DRESSINGS) ×2 IMPLANT
SPONGE VERSALON 2X2 STRL (MISCELLANEOUS) ×3
STRIP CLOSURE SKIN 1/4X4 (GAUZE/BANDAGES/DRESSINGS) ×2 IMPLANT
SUT MNCRL AB 4-0 PS2 18 (SUTURE) ×2 IMPLANT
SUT PLAIN GUT 0 (SUTURE) ×6 IMPLANT
SUT VIC AB 2-0 CT1 (SUTURE) ×4 IMPLANT
SUT VIC AB 2-0 UR6 27 (SUTURE) ×5 IMPLANT
SUT VIC AB 4-0 SH 27 (SUTURE) ×3
SUT VIC AB 4-0 SH 27XANBCTRL (SUTURE) ×1 IMPLANT
SWABSTK COMLB BENZOIN TINCTURE (MISCELLANEOUS) ×3 IMPLANT
SYRINGE 10CC LL (SYRINGE) ×3 IMPLANT

## 2016-12-06 NOTE — Progress Notes (Signed)
Took patient up to the bathroom at 3:12, as we were nearing the bathroom she felt a bit lightheaded. We quickly went to the bathroom, while in the toilet she felt like she "had to pass out and blackout". I have her an ammonia inhalent, apple juice, and wet rag while in the toilet. We were in there for about 10 mins while she felt better to go to the wheelchair. Right afterwards checked her BP, initial BP was 96/65. Gave her 500mL bolus starting at 0328. Her BP were ranging in the upper 140s/150s throughout her labor. I gave her 100mg  of Labetalol PO per order at 0158. The drop of BP, getting up to void all together I think caused this episode. Dr. Elesa MassedWard was notified and update at [id number]. Patient doing really well now. No longer light-ended.

## 2016-12-06 NOTE — Anesthesia Procedure Notes (Signed)
Procedure Name: Intubation Date/Time: 12/06/2016 10:42 AM Performed by: ZOXWRUEKILDUFF, Carinne Brandenburger Pre-anesthesia Checklist: Patient identified, Emergency Drugs available, Timeout performed, Patient being monitored and Suction available Patient Re-evaluated:Patient Re-evaluated prior to inductionOxygen Delivery Method: Circle system utilized Preoxygenation: Pre-oxygenation with 100% oxygen Intubation Type: IV induction and Rapid sequence Laryngoscope Size: Mac and 4 Grade View: Grade I Tube type: Oral Tube size: 7.0 mm Number of attempts: 1 Placement Confirmation: ETT inserted through vocal cords under direct vision,  CO2 detector,  breath sounds checked- equal and bilateral and positive ETCO2 Secured at: 22 cm Tube secured with: Tape

## 2016-12-06 NOTE — Progress Notes (Signed)
PPD #0, SVD, baby boy   S:  Reports feeling better, still feels slightly dizzy with ambulation             Tolerating po/ No nausea or vomiting             Bleeding is light/appropriate              Pain controlled with Motrin             Up ad lib / ambulatory with assistance / voiding QS  Newborn breast feeding  O:               VS: BP 122/83 (BP Location: Right Arm)   Pulse (!) 103   Temp 98.5 F (36.9 C) (Oral)   Resp 17   Ht 5\' 7"  (1.702 m)   Wt 90.7 kg (200 lb)   LMP 03/15/2016   SpO2 98%   Breastfeeding? Unknown   BMI 31.32 kg/m    LABS:              Recent Labs  12/05/16 1518 12/06/16 0607  WBC 12.1* 20.3*  HGB 11.2* 9.0*  PLT 324 268               Blood type: --/--/O POS (12/15 1518)  Rubella:        Non-immune                I&O: Intake/Output      12/15 0701 - 12/16 0700 12/16 0701 - 12/17 0700   Urine (mL/kg/hr) 600    Total Output 600     Net -600          Urine Occurrence 2 x                  Physical Exam:             Alert and oriented X3  Exam deferred until family leaves     A: PPD # 0   Gestational HTN - delivered, BPs stable, on Labetalol 100mg  BID  Doing well - stable status  Rubella and Varicella non-immune  ABL Anemia   P: Routine post partum orders  Planning for PP BTL today - has been NPO since 0330   Oral FE BID   Anticipate discharge home tomorrow or Monday  Carlean JewsMeredith Britini Garcilazo, [place]

## 2016-12-06 NOTE — Anesthesia Preprocedure Evaluation (Signed)
Anesthesia Evaluation  Patient identified by MRN, date of birth, ID band Patient awake    Reviewed: Allergy & Precautions, H&P , NPO status , Patient's Chart, lab work & pertinent test results  History of Anesthesia Complications Negative for: history of anesthetic complications  Airway Mallampati: III  TM Distance: >3 FB Neck ROM: full    Dental no notable dental hx. (+) Poor Dentition   Pulmonary neg pulmonary ROS, neg shortness of breath,    Pulmonary exam normal breath sounds clear to auscultation       Cardiovascular Exercise Tolerance: Good hypertension, (-) angina(-) Past MI Normal cardiovascular exam Rhythm:regular Rate:Normal     Neuro/Psych Seizures -, Well Controlled,  negative psych ROS   GI/Hepatic Neg liver ROS, GERD  Controlled,  Endo/Other  negative endocrine ROS  Renal/GU      Musculoskeletal   Abdominal   Peds  Hematology negative hematology ROS (+)   Anesthesia Other Findings Past Medical History: No date: Opioid abuse     Comment: clean for 4 years No date: Scoliosis  Past Surgical History: No date: BACK SURGERY  BMI    Body Mass Index:  31.32 kg/m      Reproductive/Obstetrics negative OB ROS                             Anesthesia Physical Anesthesia Plan  ASA: III  Anesthesia Plan: General ETT   Post-op Pain Management:    Induction:   Airway Management Planned:   Additional Equipment:   Intra-op Plan:   Post-operative Plan:   Informed Consent: I have reviewed the patients History and Physical, chart, labs and discussed the procedure including the risks, benefits and alternatives for the proposed anesthesia with the patient or authorized representative who has indicated his/her understanding and acceptance.   Dental Advisory Given  Plan Discussed with: Anesthesiologist, CRNA and Surgeon  Anesthesia Plan Comments:         Anesthesia  Quick Evaluation

## 2016-12-06 NOTE — Discharge Summary (Signed)
  Obstetric Discharge Summary Reason for Admission: htn Prenatal Procedures: none Intrapartum Procedures: spontaneous vaginal delivery, meconium staining . 2 gm Ancef given 7 + hr before for unknown GBS status Postpartum Procedures: PP BTL  Complications-Operative and Postpartum: none Last Labs       Hemoglobin  Date Value Ref Range Status  12/05/2016 11.2 (L) 12.0 - 16.0 g/dL Final        HGB  Date Value Ref Range Status  07/30/2013 12.0 12.0 - 16.0 g/dL Final        HCT  Date Value Ref Range Status  12/05/2016 32.9 (L) 35.0 - 47.0 % Final  07/31/2013 33.9 (L) 35.0 - 47.0 % Final      Physical Exam:  General: alert and cooperative Lochia: appropriate Uterine Fundus: firm Incision: n/a DVT Evaluation: No evidence of DVT seen on physical exam.  Discharge Diagnoses: Term Pregnancy-delivered  Discharge Information: Date: 12/06/2016 Activity: pelvic rest Diet: routine Medications: None, PNV and Ibuprofen Condition: stable Instructions: refer to practice specific booklet Discharge to: home  ABO, Rh:  O+ Antibody:  neg Rubella:  NOn Immune  VZ : NOn immune RPR:   neg HBsAg:   neg HIV:   neg GBS:   unknown - treated 1 dose 7 hr before ANCEF Newborn Data: Live born female  Birth Weight: 9 lb 4.9 oz (4220 g) APGAR: ,   Home with mother.  SCHERMERHORN,THOMAS 12/06/2016, 1:24 AM

## 2016-12-06 NOTE — Transfer of Care (Signed)
Immediate Anesthesia Transfer of Care Note  Patient: Janet Nelson  Procedure(s) Performed: Procedure(s): POST PARTUM TUBAL LIGATION (Bilateral)  Patient Location: PACU  Anesthesia Type:General  Level of Consciousness: awake, alert  and oriented  Airway & Oxygen Therapy: Patient Spontanous Breathing  Post-op Assessment: Report given to RN  Post vital signs: Reviewed and stable  Last Vitals:  Vitals:   12/06/16 1137 12/06/16 1138  BP: 134/89   Pulse: (!) 121   Resp: 20   Temp: 37 C (P) 36.3 C    Last Pain:  Vitals:   12/06/16 0821  TempSrc: Oral  PainSc:          Complications: No apparent anesthesia complications

## 2016-12-06 NOTE — Anesthesia Postprocedure Evaluation (Signed)
Anesthesia Post Note  Patient: Georgiann HahnMelissa J Watanabe  Procedure(s) Performed: Procedure(s) (LRB): POST PARTUM TUBAL LIGATION (Bilateral)  Patient location during evaluation: PACU Anesthesia Type: General Level of consciousness: awake and alert Pain management: pain level controlled Vital Signs Assessment: post-procedure vital signs reviewed and stable Respiratory status: spontaneous breathing, nonlabored ventilation, respiratory function stable and patient connected to nasal cannula oxygen Cardiovascular status: blood pressure returned to baseline and stable Postop Assessment: no signs of nausea or vomiting Anesthetic complications: no    Last Vitals:  Vitals:   12/06/16 1215 12/06/16 1242  BP:  139/87  Pulse:  92  Resp:  17  Temp: 36.8 C 36.4 C    Last Pain:  Vitals:   12/06/16 1250  TempSrc:   PainSc: 6                  Cleda MccreedyJoseph K Piscitello

## 2016-12-06 NOTE — Discharge Summary (Deleted)
Obstetric Discharge Summary Reason for Admission: htn Prenatal Procedures: none Intrapartum Procedures: spontaneous vaginal delivery, meconium staining . 2 gm Ancef given 7 + hr before for unknown GBS status Postpartum Procedures: PP BTL  Complications-Operative and Postpartum: none Hemoglobin  Date Value Ref Range Status  12/05/2016 11.2 (L) 12.0 - 16.0 g/dL Final   HGB  Date Value Ref Range Status  07/30/2013 12.0 12.0 - 16.0 g/dL Final   HCT  Date Value Ref Range Status  12/05/2016 32.9 (L) 35.0 - 47.0 % Final  07/31/2013 33.9 (L) 35.0 - 47.0 % Final    Physical Exam:  General: alert and cooperative Lochia: appropriate Uterine Fundus: firm Incision: n/a DVT Evaluation: No evidence of DVT seen on physical exam.  Discharge Diagnoses: Term Pregnancy-delivered  Discharge Information: Date: 12/06/2016 Activity: pelvic rest Diet: routine Medications: None, PNV and Ibuprofen Condition: stable Instructions: refer to practice specific booklet Discharge to: home   Newborn Data: Live born female  Birth Weight: 9 lb 4.9 oz (4220 g) APGAR: ,   Home with mother.  Jarah Pember 12/06/2016, 1:24 AM

## 2016-12-06 NOTE — Op Note (Signed)
  Post Partum Tubal Ligation  Indication for procedure: desired permanent sterilization  Pre-op diagnosis: s/p term vaginal delivery, desired permanent sterilization Post-op diagnosis: same Procedure: post partum bilateral tubal ligation Surgeon: Edelmira Gallogly Assist: none Anesthesia: general  IVF: 600cc EBL: minimal UOP: 0cc Findings: patent bilateral tubes, normal post-gravid uterine fundus Specimens: left tube with fimbriated end, right tube with fimbriated end Complications: none apparent Disposition: stable to PACU  Procedure in detail: The patient was seen and confirmed desire for permanent sterilization.  She was identified as Georgiann HahnMelissa J Trang and was brought to the OR.  General anesthesia was administered, and the patient was prepped and draped in the usual sterile fashion.  A surgical time-out was called.  An 11-blade was used to incise the skin in the infraumbilical area.  The subcutaneous tissues were dissected to and then through the fascia, and the peritoneum was grasped and divided.  An alexis retractor was placed in the abdominal cavity and positioned.  The uterus was identified, and the right tube was grasped with a babcock and traced to the fimbriated end.  A kelly clamp was placed in a step-wise fashion along the mesosalpinx and metzenbaums were used to transect the tube from the underlying tissue.  2-0 Vicryl was used to tie the remaining tissue.  The same steps were used on the left side.  All dissected ends were found to be hemostatic.  The alexis retractor was removed, and the fascia was reapproximated with 2-0 vicryl.  A mix of long-and short acting local anesthetic was injected into the fascia, skin, and subcutaneous tissues. The subcutaneous tissue was irrigated and then the skin was closed with 4-0 monocryl.  The skin was covered in surgical glue.  The sponge, needle, and instrument counts were correct x2.  The patient tolerated the procedure and was brought to PACU in  a stable condition.  Ranae Plumberhelsea Evelina Lore, MD Attending Obstetrician and Gynecologist [facility]

## 2016-12-07 LAB — CBC
HCT: 22.6 % — ABNORMAL LOW (ref 35.0–47.0)
HCT: 24.6 % — ABNORMAL LOW (ref 35.0–47.0)
Hemoglobin: 7.4 g/dL — ABNORMAL LOW (ref 12.0–16.0)
Hemoglobin: 8.3 g/dL — ABNORMAL LOW (ref 12.0–16.0)
MCH: 30 pg (ref 26.0–34.0)
MCH: 30.6 pg (ref 26.0–34.0)
MCHC: 32.8 g/dL (ref 32.0–36.0)
MCHC: 33.6 g/dL (ref 32.0–36.0)
MCV: 91.4 fL (ref 80.0–100.0)
MCV: 91.3 fL (ref 80.0–100.0)
PLATELETS: 239 10*3/uL (ref 150–440)
PLATELETS: 238 10*3/uL (ref 150–440)
RBC: 2.47 MIL/uL — AB (ref 3.80–5.20)
RBC: 2.69 MIL/uL — AB (ref 3.80–5.20)
RDW: 13.5 % (ref 11.5–14.5)
RDW: 13.6 % (ref 11.5–14.5)
WBC: 14.9 10*3/uL — AB (ref 3.6–11.0)
WBC: 14.7 10*3/uL — AB (ref 3.6–11.0)

## 2016-12-07 LAB — PREPARE RBC (CROSSMATCH)

## 2016-12-07 LAB — ABO/RH: ABO/RH(D): O POS

## 2016-12-07 MED ORDER — SODIUM CHLORIDE 0.9 % IV SOLN
Freq: Once | INTRAVENOUS | Status: AC
  Administered 2016-12-07: 12:00:00 via INTRAVENOUS

## 2016-12-07 NOTE — Progress Notes (Signed)
PPD #1, SVD, baby boy   S:  Reports feeling weak and tired, but denies dizziness, VSS             Tolerating po/ No nausea or vomiting             Bleeding is light             Pain controlled with Motrin and Tylenol             Up ad lib / ambulatory / voiding QS  Newborn breast and formula feeding  / Circumcision planning for today by Peds  O:               VS: BP 138/87   Pulse 89   Temp 97.6 F (36.4 C) (Oral)   Resp 18   Ht 5\' 7"  (1.702 m)   Wt 90.7 kg (200 lb)   LMP 03/15/2016   SpO2 100%   Breastfeeding? Unknown   BMI 31.32 kg/m    LABS:              Recent Labs  12/06/16 0607 12/07/16 0528  WBC 20.3* 14.9*  HGB 9.0* 7.4*  PLT 268 239               Blood type: --/--/O POS (12/17 0528)  Rubella:     Non-immune  Varicella: non-immune                  I&O: Intake/Output      12/16 0701 - 12/17 0700 12/17 0701 - 12/18 0700   P.O. 120    I.V. (mL/kg) 800 (8.8)    Total Intake(mL/kg) 920 (10.1)    Urine (mL/kg/hr) 2400 (1.1)    Total Output 2400     Net -1480                        Physical Exam:             Alert and oriented X3  Lungs: Clear and unlabored  Heart: regular rate and rhythm / no mumurs  Abdomen: soft, non-tender, non-distended, incision site C/D/i - no erythema or edema or s/s of infection             Fundus: firm, non-tender, U-2  Perineum: intact  Lochia: appropriate  Extremities: no edema, no calf pain or tenderness    A: PPD # 1   Doing well - stable status  Symptomatic ABL Anemia  Gestational Hypertension, delivered BPs stable  Varicella and Rubella Non-immune  P: Routine post partum orders  Transfuse 1 unit PRBCs, pre-medicate with Tylenol and Benadryl, and repeat a CBC 4 hours after transfusion   Continue on Labetalol for now, consider d/c at discharge, but plan to f/u in 1 week for a BP check  Offer Varicella and Rubella vaccines prior to d/c  Anticipate discharge home tomorrow   Discussed plan of care with Dr. Elesa MassedWard.  She  agrees.   Carlean JewsMeredith Breyden Jeudy, CNM

## 2016-12-08 ENCOUNTER — Encounter: Payer: Self-pay | Admitting: Obstetrics and Gynecology

## 2016-12-08 ENCOUNTER — Encounter (INDEPENDENT_AMBULATORY_CARE_PROVIDER_SITE_OTHER): Payer: Self-pay

## 2016-12-08 LAB — TYPE AND SCREEN
ABO/RH(D): O POS
Antibody Screen: NEGATIVE
UNIT DIVISION: 0

## 2016-12-08 NOTE — Discharge Instructions (Signed)
Please call your doctor or return to the ER if you experience any chest pains, shortness of breath, dizziness, fever greater than 101, any heavy bleeding (saturating more than 1 pad per hour), large clots, or foul smelling discharge, any worsening abdominal pain and cramping that is not controlled by pain medication, or any signs of postpartum depression. No tampons, enemas, douches, or sexual intercourse for 6 weeks. Check your incision daily for signs of infection such as redness, warmth, foul smelling discharge, etc. Also avoid tub baths, hot tubs, or swimming for 6 weeks.    Home Care Instructions for Mom Introduction  ACTIVITY  Gradually return to your regular activities.  Let yourself rest. Nap while your baby sleeps.  Avoid lifting anything that is heavier than 10 lb (4.5 kg) until your health care provider says it is okay.  Avoid activities that take a lot of effort and energy (are strenuous) until approved by your health care provider. Walking at a slow-to-moderate pace is usually safe.  If you had a cesarean delivery:  Do not vacuum, climb stairs, or drive a car for 4-6 weeks.  Have someone help you at home until you feel like you can do your usual activities yourself.  Do exercises as told by your health care provider, if this applies. VAGINAL BLEEDING You may continue to bleed for 4-6 weeks after delivery. Over time, the amount of blood usually decreases and the color of the blood usually gets lighter. However, the flow of bright red blood may increase if you have been too active. If you need to use more than one pad in an hour because your pad gets soaked, or if you pass a large clot:  Lie down.  Raise your feet.  Place a cold compress on your lower abdomen.  Rest.  Call your health care provider. If you are breastfeeding, your period should return anytime between 8 weeks after delivery and the time that you stop breastfeeding. If you are not breastfeeding, your period  should return 6-8 weeks after delivery. PERINEAL CARE The perineal area, or perineum, is the part of your body between your thighs. After delivery, this area needs special care. Follow these instructions as told by your health care provider.  Take warm tub baths for 15-20 minutes.  Use medicated pads and pain-relieving sprays and creams as told.  Do not use tampons or douches until vaginal bleeding has stopped.  Each time you go to the bathroom:  Use a peri bottle.  Change your pad.  Use towelettes in place of toilet paper until your stitches have healed.  Do Kegel exercises every day. Kegel exercises help to maintain the muscles that support the vagina, bladder, and bowels. You can do these exercises while you are standing, sitting, or lying down. To do Kegel exercises:  Tighten the muscles of your abdomen and the muscles that surround your birth canal.  Hold for a few seconds.  Relax.  Repeat until you have done this 5 times in a row.  To prevent hemorrhoids from developing or getting worse:  Drink enough fluid to keep your urine clear or pale yellow.  Avoid straining when having a bowel movement.  Take over-the-counter medicines and stool softeners as told by your health care provider. BREAST CARE  Wear a tight-fitting bra.  Avoid taking over-the-counter pain medicine for breast discomfort.  Apply ice to the breasts to help with discomfort as needed:  Put ice in a plastic bag.  Place a towel between your skin  and the bag.  Leave the ice on for 20 minutes or as told by your health care provider. NUTRITION  Eat a well-balanced diet.  Do not try to lose weight quickly by cutting back on calories.  Take your prenatal vitamins until your postpartum checkup or until your health care provider tells you to stop. POSTPARTUM DEPRESSION You may find yourself crying for no apparent reason and unable to cope with all of the changes that come with having a newborn. This  mood is called postpartum depression. Postpartum depression happens because your hormone levels change after delivery. If you have postpartum depression, get support from your partner, friends, and family. If the depression does not go away on its own after several weeks, contact your health care provider. BREAST SELF-EXAM Do a breast self-exam each month, at the same time of the month. If you are breastfeeding, check your breasts just after a feeding, when your breasts are less full. If you are breastfeeding and your period has started, check your breasts on day 5, 6, or 7 of your period. Report any lumps, bumps, or discharge to your health care provider. Know that breasts are normally lumpy if you are breastfeeding. This is temporary, and it is not a health risk. INTIMACY AND SEXUALITY Avoid sexual activity for at least 3-4 weeks after delivery or until the brownish-red vaginal flow is completely gone. If you want to avoid pregnancy, use some form of birth control. You can get pregnant after delivery, even if you have not had your period. SEEK MEDICAL CARE IF:  You feel unable to cope with the changes that a child brings to your life, and these feelings do not go away after several weeks.  You notice a lump, a bump, or discharge on your breast. SEEK IMMEDIATE MEDICAL CARE IF:  Blood soaks your pad in 1 hour or less.  You have:  Severe pain or cramping in your lower abdomen.  A bad-smelling vaginal discharge.  A fever that is not controlled by medicine.  A fever, and an area of your breast is red and sore.  Pain or redness in your calf.  Sudden, severe chest pain.  Shortness of breath.  Painful or bloody urination.  Problems with your vision.  You vomit for 12 hours or longer.  You develop a severe headache.  You have serious thoughts about hurting yourself, your child, or anyone else. This information is not intended to replace advice given to you by your health care  provider. Make sure you discuss any questions you have with your health care provider. Document Released: 12/05/2000 Document Revised: 05/15/2016 Document Reviewed: 06/11/2015  2017 Elsevier Laparoscopic Tubal Ligation Introduction Laparoscopic tubal ligation is a procedure to close the fallopian tubes. This is done so that you cannot get pregnant. When the fallopian tubes are closed, the eggs that your ovaries release cannot enter the uterus, and sperm cannot reach the released eggs. A laparoscopic tubal ligation is sometimes called "getting your tubes tied." You should not have this procedure if you want to get pregnant someday or if you are unsure about having more children. Tell a health care provider about:  Any allergies you have.  All medicines you are taking, including vitamins, herbs, eye drops, creams, and over-the-counter medicines.  Any problems you or family members have had with anesthetic medicines.  Any blood disorders you have.  Any surgeries you have had.  Any medical conditions you have.  Whether you are pregnant or may be pregnant.  Any past pregnancies. What are the risks? Generally, this is a safe procedure. However, problems may occur, including:  Infection.  Bleeding.  Injury to surrounding organs.  Side effects from anesthetics.  Failure of the procedure. This procedure can increase your risk of a kind of pregnancy in which a fertilized egg attaches to the outside of the uterus (ectopic pregnancy). What happens before the procedure?  Ask your health care provider about:  Changing or stopping your regular medicines. This is especially important if you are taking diabetes medicines or blood thinners.  Taking medicines such as aspirin and ibuprofen. These medicines can thin your blood. Do not take these medicines before your procedure if your health care provider instructs you not to.  Follow instructions from your health care provider about eating  and drinking restrictions.  Plan to have someone take you home after the procedure.  If you go home right after the procedure, plan to have someone with you for 24 hours. What happens during the procedure?  You will be given one or more of the following:  A medicine to help you relax (sedative).  A medicine to numb the area (local anesthetic).  A medicine to make you fall asleep (general anesthetic).  A medicine that is injected into an area of your body to numb everything below the injection site (regional anesthetic).  An IV tube will be inserted into one of your veins. It will be used to give you medicines and fluids during the procedure.  Your bladder may be emptied with a small tube (catheter).  If you have been given a general anesthetic, a tube will be put down your throat to help you breathe.  Two small cuts (incisions) will be made in your lower abdomen and near your belly button.  Your abdomen will be inflated with a gas. This will let the surgeon see better and will give the surgeon room to work.  A thin, lighted tube (laparoscope) with a camera attached will be inserted into your abdomen through one of the incisions. Small instruments will be inserted through the other incision.  The fallopian tubes will be tied off, burned (cauterized), or blocked with a clip, ring, or clamp. A small portion in the center of each fallopian tube may be removed.  The gas will be released from the abdomen.  The incisions will be closed with stitches (sutures).  A bandage (dressing) will be placed over the incisions. The procedure may vary among health care providers and hospitals. What happens after the procedure?  Your blood pressure, heart rate, breathing rate, and blood oxygen level will be monitored often until the medicines you were given have worn off.  You will be given medicine to help with pain, nausea, and vomiting as needed. This information is not intended to replace  advice given to you by your health care provider. Make sure you discuss any questions you have with your health care provider. Document Released: 03/16/2001 Document Revised: 05/15/2016 Document Reviewed: 11/18/2015  2017 Elsevier

## 2016-12-08 NOTE — Progress Notes (Signed)
Discharge order received from doctor. Reviewed discharge instructions with patient and answered all questions. Follow up appointment given. Patient verbalized understanding. ID bands checked. Patient discharged home with infant via wheelchair by nursing/auxillary.    Annalee Meyerhoff Garner, RN  

## 2016-12-08 NOTE — Progress Notes (Signed)
Post Partum Day 1/POD#1 Subjective: no complaints, up ad lib, voiding and tolerating PO, feels better after I upc  Objective: Blood pressure 112/68, pulse 71, temperature 98 F (36.7 C), resp. rate 20, height 5\' 7"  (1.702 m), weight 200 lb (90.7 kg), last menstrual period 03/15/2016, SpO2 97 %, unknown if currently breastfeeding.  Physical Exam:  General: alert, cooperative and appears stated age 54Lochia: appropriate Uterine Fundus: firm Incision: C/D/I DVT Evaluation: Neg Homans   Recent Labs  12/07/16 0528 12/07/16 2201  HGB 7.4* 8.3*  HCT 22.6* 24.6*  WBC 14.9* 14.7*  PLT 239 238    Assessment/Plan: Discharge home and Breastfeeding  P: BTL done FU at 1-2 weeks in office   LOS: 3 days   Sharee PimpleCaron W Gurtej Noyola 12/08/2016, 9:17 AM

## 2016-12-09 LAB — SURGICAL PATHOLOGY

## 2016-12-17 ENCOUNTER — Telehealth: Payer: Self-pay | Admitting: Obstetrics and Gynecology

## 2016-12-17 NOTE — Telephone Encounter (Signed)
Called pt to check on her and LMP to continue Procardia 30 mg XL and fu at office at 6 weeks pp

## 2018-01-18 ENCOUNTER — Ambulatory Visit: Payer: Managed Care, Other (non HMO)

## 2018-02-01 ENCOUNTER — Ambulatory Visit: Payer: Managed Care, Other (non HMO)

## 2018-02-08 ENCOUNTER — Ambulatory Visit: Payer: Managed Care, Other (non HMO)

## 2018-02-24 ENCOUNTER — Other Ambulatory Visit: Payer: Self-pay | Admitting: Physician Assistant

## 2018-02-24 DIAGNOSIS — R1011 Right upper quadrant pain: Secondary | ICD-10-CM

## 2018-03-01 ENCOUNTER — Ambulatory Visit: Payer: Managed Care, Other (non HMO) | Attending: Obstetrics and Gynecology

## 2018-03-04 ENCOUNTER — Ambulatory Visit
Admission: RE | Admit: 2018-03-04 | Discharge: 2018-03-04 | Disposition: A | Discharge status: Home / Self Care | Payer: Managed Care, Other (non HMO) | Source: Ambulatory Visit | Attending: Physician Assistant | Admitting: Physician Assistant

## 2018-03-04 DIAGNOSIS — R1011 Right upper quadrant pain: Secondary | ICD-10-CM

## 2018-03-15 ENCOUNTER — Ambulatory Visit: Payer: Managed Care, Other (non HMO)

## 2018-03-22 ENCOUNTER — Ambulatory Visit: Payer: Managed Care, Other (non HMO)

## 2019-08-10 IMAGING — US US ABDOMEN LIMITED
1 series · 14 of 25 positions shown · non-contrast
Comparison: None.

CLINICAL DATA: Right upper quadrant abdomen pain.

EXAM:
ULTRASOUND ABDOMEN LIMITED RIGHT UPPER QUADRANT

[Series 1: us abdomen limited · 0.20mm/px · 14 of 42 slices shown]
[im 1/42]
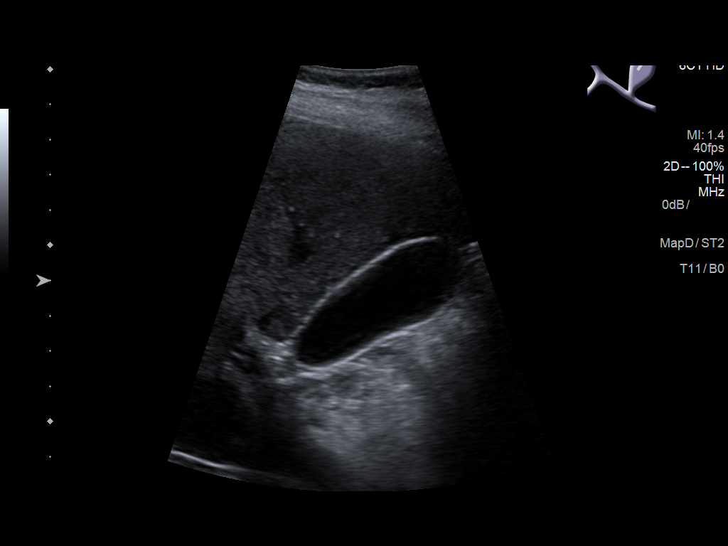
[im 4/42]
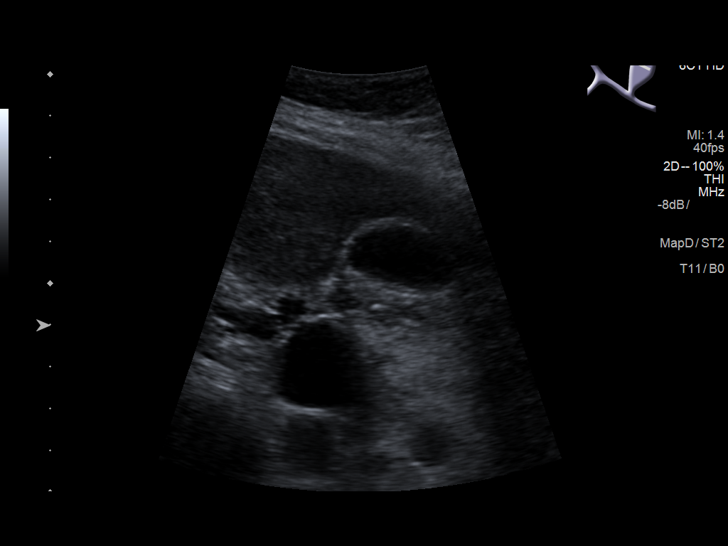
[im 7/42]
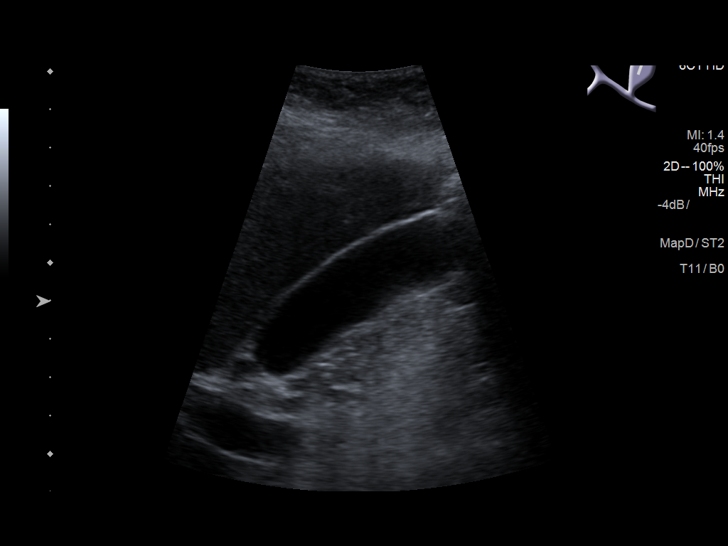
[im 11/42]
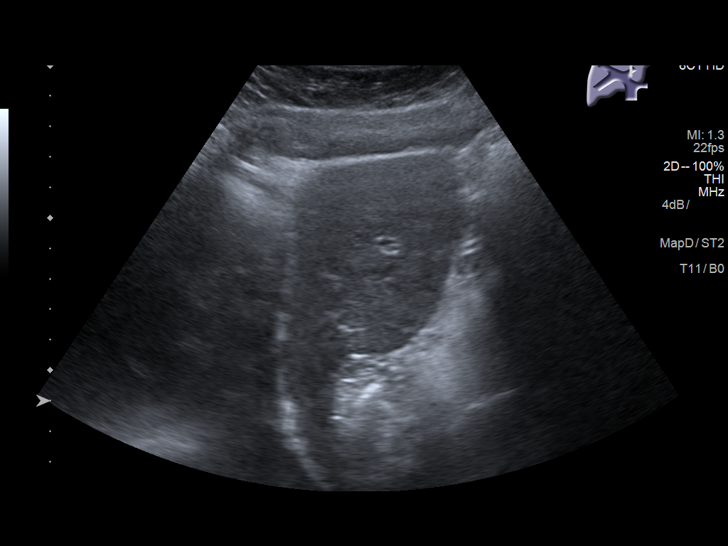
[im 14/42]
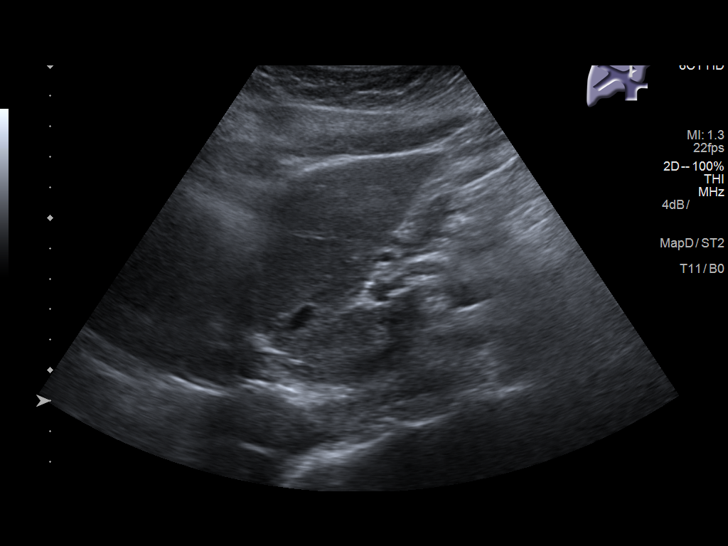
[im 16/42]
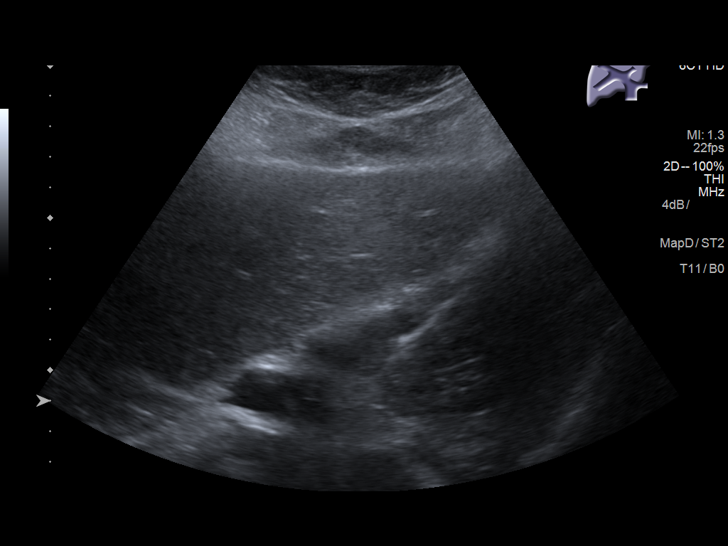
[im 19/42]
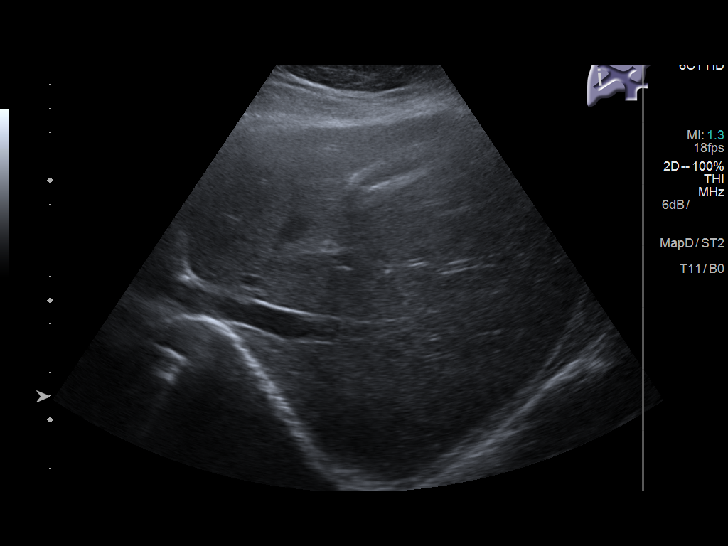
[im 23/42]
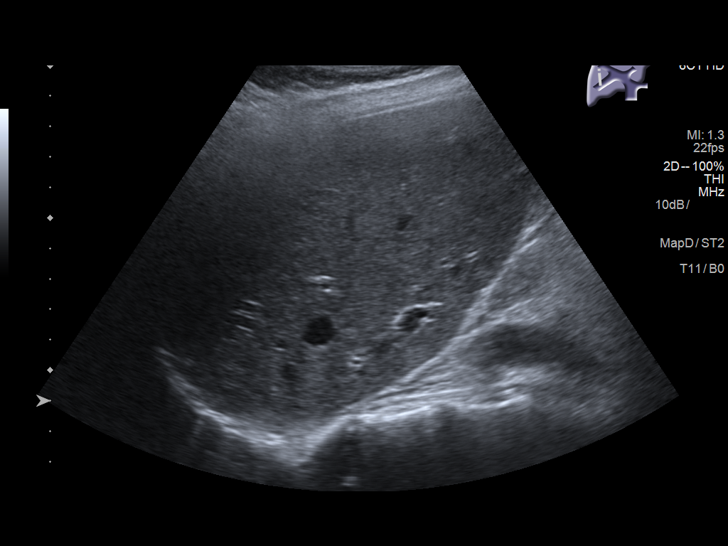
[im 26/42]
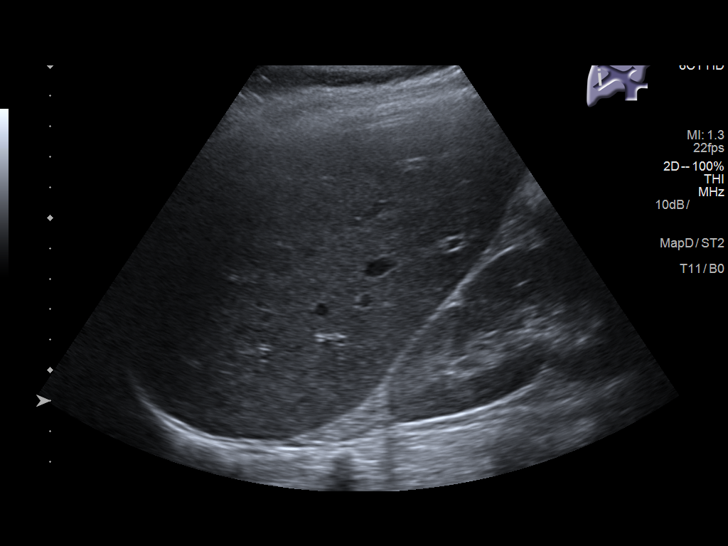
[im 28/42]
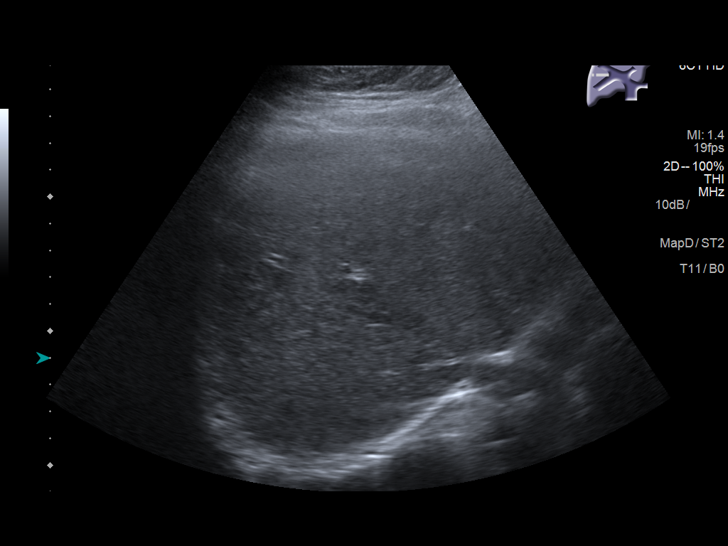
[im 31/42]
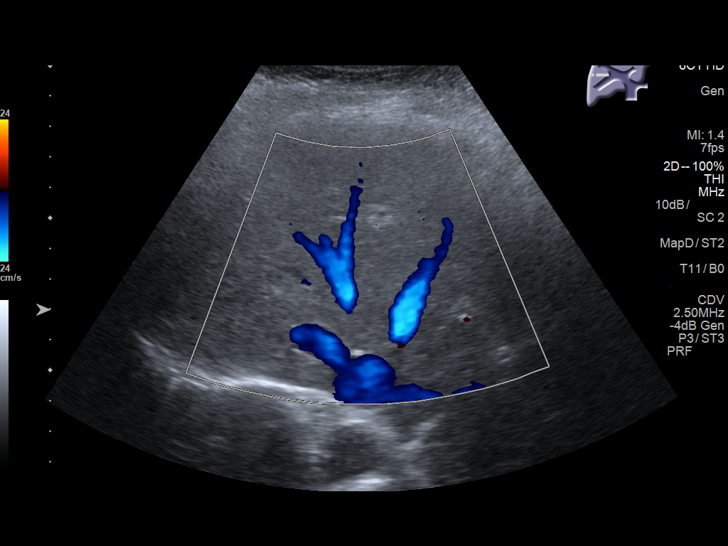
[im 35/42]
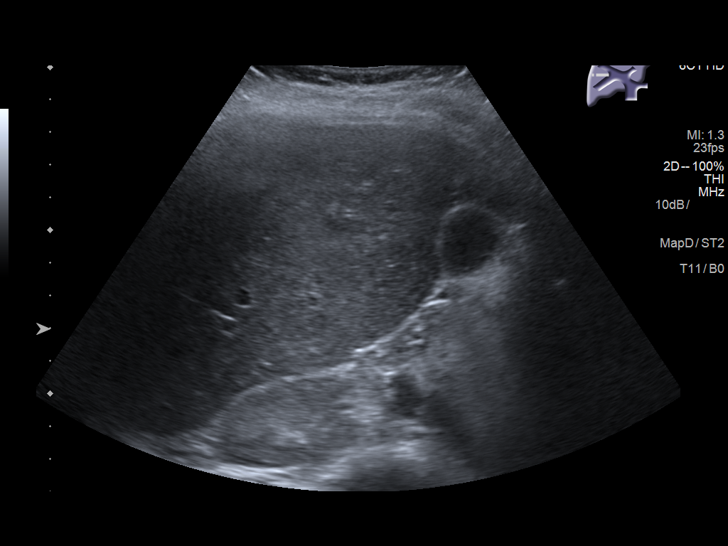
[im 38/42]
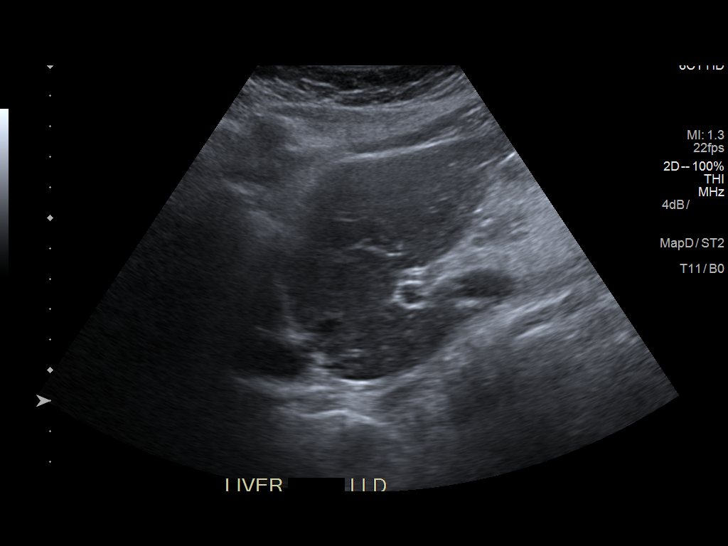
[im 42/42]
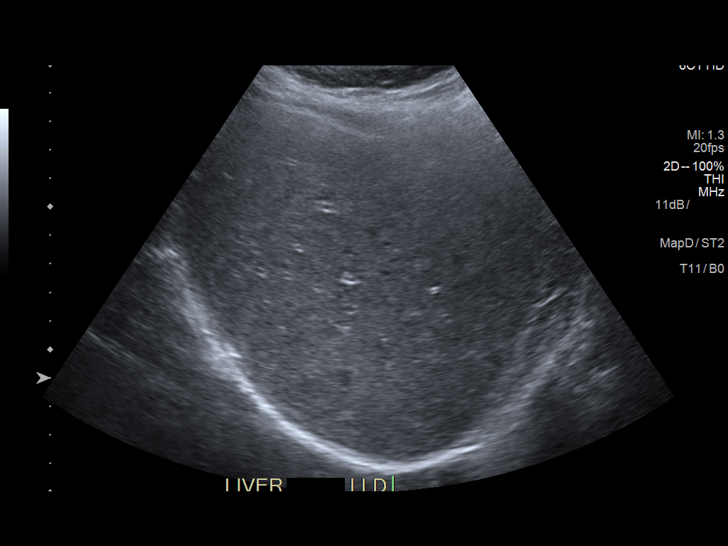

[14 of 25 positions shown; findings below may reference images not displayed]

FINDINGS: Gallbladder:

No gallstones or wall thickening visualized. No sonographic Murphy
sign noted by sonographer.

Common bile duct:

Diameter: 1.5 mm

Liver:

No focal lesion identified. Within normal limits in parenchymal
echogenicity. Portal vein is patent on color Doppler imaging with
normal direction of blood flow towards the liver.
IMPRESSION: Normal right upper quadrant ultrasound.

## 2022-12-12 ENCOUNTER — Other Ambulatory Visit: Payer: Self-pay | Admitting: Certified Nurse Midwife

## 2022-12-12 DIAGNOSIS — Z1231 Encounter for screening mammogram for malignant neoplasm of breast: Secondary | ICD-10-CM

## 2022-12-22 HISTORY — PX: BREAST BIOPSY: SHX20

## 2023-01-01 ENCOUNTER — Ambulatory Visit
Admission: RE | Admit: 2023-01-01 | Discharge: 2023-01-01 | Disposition: A | Discharge status: Home / Self Care | Payer: 59 | Source: Ambulatory Visit | Attending: Certified Nurse Midwife | Admitting: Certified Nurse Midwife

## 2023-01-01 ENCOUNTER — Encounter: Payer: Self-pay | Admitting: Radiology

## 2023-01-01 DIAGNOSIS — Z1231 Encounter for screening mammogram for malignant neoplasm of breast: Secondary | ICD-10-CM

## 2023-01-06 ENCOUNTER — Other Ambulatory Visit: Payer: Self-pay | Admitting: Certified Nurse Midwife

## 2023-01-06 DIAGNOSIS — N63 Unspecified lump in unspecified breast: Secondary | ICD-10-CM

## 2023-01-06 DIAGNOSIS — R928 Other abnormal and inconclusive findings on diagnostic imaging of breast: Secondary | ICD-10-CM

## 2023-01-15 ENCOUNTER — Ambulatory Visit
Admission: RE | Admit: 2023-01-15 | Discharge: 2023-01-15 | Disposition: A | Discharge status: Home / Self Care | Payer: 59 | Source: Ambulatory Visit | Attending: Certified Nurse Midwife | Admitting: Certified Nurse Midwife

## 2023-01-15 DIAGNOSIS — N63 Unspecified lump in unspecified breast: Secondary | ICD-10-CM | POA: Diagnosis present

## 2023-01-15 DIAGNOSIS — R928 Other abnormal and inconclusive findings on diagnostic imaging of breast: Secondary | ICD-10-CM

## 2023-01-19 ENCOUNTER — Other Ambulatory Visit: Payer: Self-pay | Admitting: Certified Nurse Midwife

## 2023-01-19 DIAGNOSIS — R599 Enlarged lymph nodes, unspecified: Secondary | ICD-10-CM

## 2023-01-19 DIAGNOSIS — R928 Other abnormal and inconclusive findings on diagnostic imaging of breast: Secondary | ICD-10-CM

## 2023-01-20 ENCOUNTER — Ambulatory Visit
Admission: RE | Admit: 2023-01-20 | Discharge: 2023-01-20 | Disposition: A | Discharge status: Home / Self Care | Payer: 59 | Source: Ambulatory Visit | Attending: Certified Nurse Midwife | Admitting: Certified Nurse Midwife

## 2023-01-20 DIAGNOSIS — Z8572 Personal history of non-Hodgkin lymphomas: Secondary | ICD-10-CM | POA: Diagnosis present

## 2023-01-20 DIAGNOSIS — I888 Other nonspecific lymphadenitis: Secondary | ICD-10-CM | POA: Insufficient documentation

## 2023-01-20 DIAGNOSIS — R928 Other abnormal and inconclusive findings on diagnostic imaging of breast: Secondary | ICD-10-CM

## 2023-01-20 DIAGNOSIS — R599 Enlarged lymph nodes, unspecified: Secondary | ICD-10-CM

## 2023-01-20 MED ORDER — LIDOCAINE-EPINEPHRINE 1 %-1:100000 IJ SOLN
8.0000 mL | Freq: Once | INTRAMUSCULAR | Status: AC
  Administered 2023-01-20: 8 mL
  Filled 2023-01-20: qty 8

## 2023-01-20 MED ORDER — LIDOCAINE HCL (PF) 1 % IJ SOLN
3.0000 mL | Freq: Once | INTRAMUSCULAR | Status: AC
  Administered 2023-01-20: 3 mL
  Filled 2023-01-20: qty 4

## 2023-01-21 ENCOUNTER — Encounter: Payer: Self-pay | Admitting: Radiology

## 2023-01-21 LAB — SURGICAL PATHOLOGY

## 2024-01-14 ENCOUNTER — Other Ambulatory Visit: Payer: Self-pay | Admitting: Physician Assistant

## 2024-01-14 DIAGNOSIS — Z1231 Encounter for screening mammogram for malignant neoplasm of breast: Secondary | ICD-10-CM

## 2024-08-26 ENCOUNTER — Other Ambulatory Visit: Payer: Self-pay | Admitting: Physician Assistant

## 2024-08-26 DIAGNOSIS — Z0000 Encounter for general adult medical examination without abnormal findings: Secondary | ICD-10-CM

## 2024-08-26 DIAGNOSIS — Z8249 Family history of ischemic heart disease and other diseases of the circulatory system: Secondary | ICD-10-CM

## 2024-09-07 ENCOUNTER — Other Ambulatory Visit

## 2024-09-09 ENCOUNTER — Ambulatory Visit
Admission: RE | Admit: 2024-09-09 | Discharge: 2024-09-09 | Disposition: A | Discharge status: Home / Self Care | Payer: Self-pay | Source: Ambulatory Visit | Attending: Physician Assistant | Admitting: Physician Assistant

## 2024-09-09 DIAGNOSIS — Z8249 Family history of ischemic heart disease and other diseases of the circulatory system: Secondary | ICD-10-CM | POA: Insufficient documentation

## 2024-09-09 DIAGNOSIS — Z0000 Encounter for general adult medical examination without abnormal findings: Secondary | ICD-10-CM | POA: Insufficient documentation

## 2024-09-20 ENCOUNTER — Ambulatory Visit
Admission: RE | Admit: 2024-09-20 | Discharge: 2024-09-20 | Disposition: A | Discharge status: Home / Self Care | Source: Ambulatory Visit | Attending: Physician Assistant | Admitting: Physician Assistant

## 2024-09-20 DIAGNOSIS — Z1231 Encounter for screening mammogram for malignant neoplasm of breast: Secondary | ICD-10-CM | POA: Insufficient documentation
